# Patient Record
Sex: Female | Born: 1935
Health system: Southern US, Community
[De-identification: ages and names within clinical notes are randomized; demographics above are authoritative.]

## PROBLEM LIST (undated history)

## (undated) DIAGNOSIS — I10 Essential (primary) hypertension: Secondary | ICD-10-CM

## (undated) DIAGNOSIS — E785 Hyperlipidemia, unspecified: Secondary | ICD-10-CM

## (undated) HISTORY — DX: Hyperlipidemia, unspecified: E78.5

## (undated) HISTORY — PX: ABDOMINAL HYSTERECTOMY: SHX81

## (undated) HISTORY — DX: Essential (primary) hypertension: I10

## (undated) HISTORY — PX: KNEE SURGERY: SHX244

---

## 1997-09-29 ENCOUNTER — Other Ambulatory Visit: Admission: RE | Admit: 1997-09-29 | Discharge: 1997-09-29 | Payer: Self-pay | Admitting: Obstetrics & Gynecology

## 2004-02-16 ENCOUNTER — Ambulatory Visit: Payer: Self-pay | Admitting: Cardiology

## 2009-09-16 ENCOUNTER — Inpatient Hospital Stay (HOSPITAL_COMMUNITY): Admission: RE | Admit: 2009-09-16 | Discharge: 2009-09-19 | Payer: Self-pay | Admitting: Orthopedic Surgery

## 2010-01-20 ENCOUNTER — Inpatient Hospital Stay (HOSPITAL_COMMUNITY)
Admission: RE | Admit: 2010-01-20 | Discharge: 2010-01-22 | Payer: Self-pay | Source: Home / Self Care | Attending: Orthopedic Surgery | Admitting: Orthopedic Surgery

## 2010-04-19 LAB — BASIC METABOLIC PANEL
CO2: 28 mEq/L (ref 19–32)
CO2: 29 mEq/L (ref 19–32)
Calcium: 8.6 mg/dL (ref 8.4–10.5)
Creatinine, Ser: 0.9 mg/dL (ref 0.4–1.2)
GFR calc non Af Amer: >60 mL/min (ref >60)
GFR calc non Af Amer: >60 mL/min (ref >60)
Glucose, Bld: 126 mg/dL — ABNORMAL HIGH (ref 70–99)
Sodium: 140 mEq/L (ref 135–145)

## 2010-04-19 LAB — PROTIME-INR
INR: 1.08 (ref 0.00–1.49)
INR: 1.4 (ref 0.00–1.49)
Prothrombin Time: 17.4 seconds — ABNORMAL HIGH (ref 11.6–15.2)

## 2010-04-19 LAB — CBC
HCT: 27.5 % — ABNORMAL LOW (ref 36.0–46.0)
HCT: 30.6 % — ABNORMAL LOW (ref 36.0–46.0)
MCH: 28 pg (ref 26.0–34.0)
MCH: 28.5 pg (ref 26.0–34.0)
MCHC: 32 g/dL (ref 30.0–36.0)
MCHC: 32 g/dL (ref 30.0–36.0)
MCV: 89 fL (ref 78.0–100.0)
RBC: 3.14 MIL/uL — ABNORMAL LOW (ref 3.87–5.11)
RBC: 3.44 MIL/uL — ABNORMAL LOW (ref 3.87–5.11)
RDW: 14.1 % (ref 11.5–15.5)
RDW: 14.2 % (ref 11.5–15.5)
WBC: 10.5 10*3/uL (ref 4.0–10.5)
WBC: 9.7 10*3/uL (ref 4.0–10.5)

## 2010-04-19 LAB — HEMOGLOBIN AND HEMATOCRIT, BLOOD: HCT: 30.7 % — ABNORMAL LOW (ref 36.0–46.0)

## 2010-04-20 LAB — TYPE AND SCREEN: Antibody Screen: NEGATIVE

## 2010-04-20 LAB — URINALYSIS, ROUTINE W REFLEX MICROSCOPIC
Glucose, UA: NEGATIVE mg/dL
Hgb urine dipstick: NEGATIVE
Ketones, ur: NEGATIVE mg/dL
Protein, ur: NEGATIVE mg/dL
Specific Gravity, Urine: 1.026 (ref 1.005–1.030)
pH: 5 (ref 5.0–8.0)

## 2010-04-20 LAB — COMPREHENSIVE METABOLIC PANEL
AST: 17 U/L (ref 0–37)
Albumin: 4 g/dL (ref 3.5–5.2)
Alkaline Phosphatase: 62 U/L (ref 39–117)
BUN: 14 mg/dL (ref 6–23)
Chloride: 103 mEq/L (ref 96–112)
Creatinine, Ser: 1.01 mg/dL (ref 0.4–1.2)
Glucose, Bld: 102 mg/dL — ABNORMAL HIGH (ref 70–99)
Sodium: 137 mEq/L (ref 135–145)

## 2010-04-20 LAB — CBC
MCH: 28.8 pg (ref 26.0–34.0)
Platelets: 267 10*3/uL (ref 150–400)
RDW: 14.3 % (ref 11.5–15.5)

## 2010-04-20 LAB — SURGICAL PCR SCREEN: MRSA, PCR: NEGATIVE

## 2010-04-23 LAB — CBC
HCT: 30.2 % — ABNORMAL LOW (ref 36.0–46.0)
HCT: 31.4 % — ABNORMAL LOW (ref 36.0–46.0)
Hemoglobin: 10.3 g/dL — ABNORMAL LOW (ref 12.0–15.0)
MCH: 29.9 pg (ref 26.0–34.0)
MCH: 30.1 pg (ref 26.0–34.0)
MCH: 31.5 pg (ref 26.0–34.0)
MCHC: 32.7 g/dL (ref 30.0–36.0)
MCHC: 32.8 g/dL (ref 30.0–36.0)
MCHC: 34.4 g/dL (ref 30.0–36.0)
MCV: 91.2 fL (ref 78.0–100.0)
MCV: 91.5 fL (ref 78.0–100.0)
Platelets: 249 10*3/uL (ref 150–400)
Platelets: 279 10*3/uL (ref 150–400)
RBC: 3.65 MIL/uL — ABNORMAL LOW (ref 3.87–5.11)
RDW: 13.9 % (ref 11.5–15.5)
RDW: 14 % (ref 11.5–15.5)

## 2010-04-23 LAB — BASIC METABOLIC PANEL
BUN: 11 mg/dL (ref 6–23)
BUN: 12 mg/dL (ref 6–23)
CO2: 24 mEq/L (ref 19–32)
CO2: 28 mEq/L (ref 19–32)
CO2: 32 mEq/L (ref 19–32)
Calcium: 8.4 mg/dL (ref 8.4–10.5)
Chloride: 100 mEq/L (ref 96–112)
Chloride: 104 mEq/L (ref 96–112)
Creatinine, Ser: 1.03 mg/dL (ref 0.4–1.2)
GFR calc Af Amer: >60 mL/min (ref >60)
Glucose, Bld: 121 mg/dL — ABNORMAL HIGH (ref 70–99)
Glucose, Bld: 135 mg/dL — ABNORMAL HIGH (ref 70–99)
Glucose, Bld: 167 mg/dL — ABNORMAL HIGH (ref 70–99)
Potassium: 4.1 mEq/L (ref 3.5–5.1)
Potassium: 5.3 mEq/L — ABNORMAL HIGH (ref 3.5–5.1)
Sodium: 141 mEq/L (ref 135–145)

## 2010-04-23 LAB — TYPE AND SCREEN: Antibody Screen: NEGATIVE

## 2010-04-23 LAB — SURGICAL PCR SCREEN
MRSA, PCR: NEGATIVE
Staphylococcus aureus: NEGATIVE

## 2010-04-23 LAB — COMPREHENSIVE METABOLIC PANEL
Albumin: 4.4 g/dL (ref 3.5–5.2)
BUN: 15 mg/dL (ref 6–23)
CO2: 29 mEq/L (ref 19–32)
Calcium: 10 mg/dL (ref 8.4–10.5)
Creatinine, Ser: 0.93 mg/dL (ref 0.4–1.2)
GFR calc Af Amer: >60 mL/min (ref >60)
GFR calc non Af Amer: 59 mL/min — ABNORMAL LOW (ref >60)
Sodium: 141 mEq/L (ref 135–145)
Total Protein: 8 g/dL (ref 6.0–8.3)

## 2010-04-23 LAB — URINALYSIS, ROUTINE W REFLEX MICROSCOPIC
Glucose, UA: NEGATIVE mg/dL
Hgb urine dipstick: NEGATIVE
Ketones, ur: NEGATIVE mg/dL
Nitrite: NEGATIVE
Protein, ur: NEGATIVE mg/dL
Specific Gravity, Urine: 1.007 (ref 1.005–1.030)
Urobilinogen, UA: 0.2 mg/dL (ref 0.0–1.0)

## 2010-04-23 LAB — ABO/RH: ABO/RH(D): B POS

## 2011-10-24 IMAGING — CR DG KNEE 1-2V PORT*L*
2 series · 2 of 2 positions shown · non-contrast
Comparison: None

CLINICAL DATA: DJD left knee.  Knee replacement.

PORTABLE LEFT KNEE - 1-2 VIEW

[ap/obl knee]
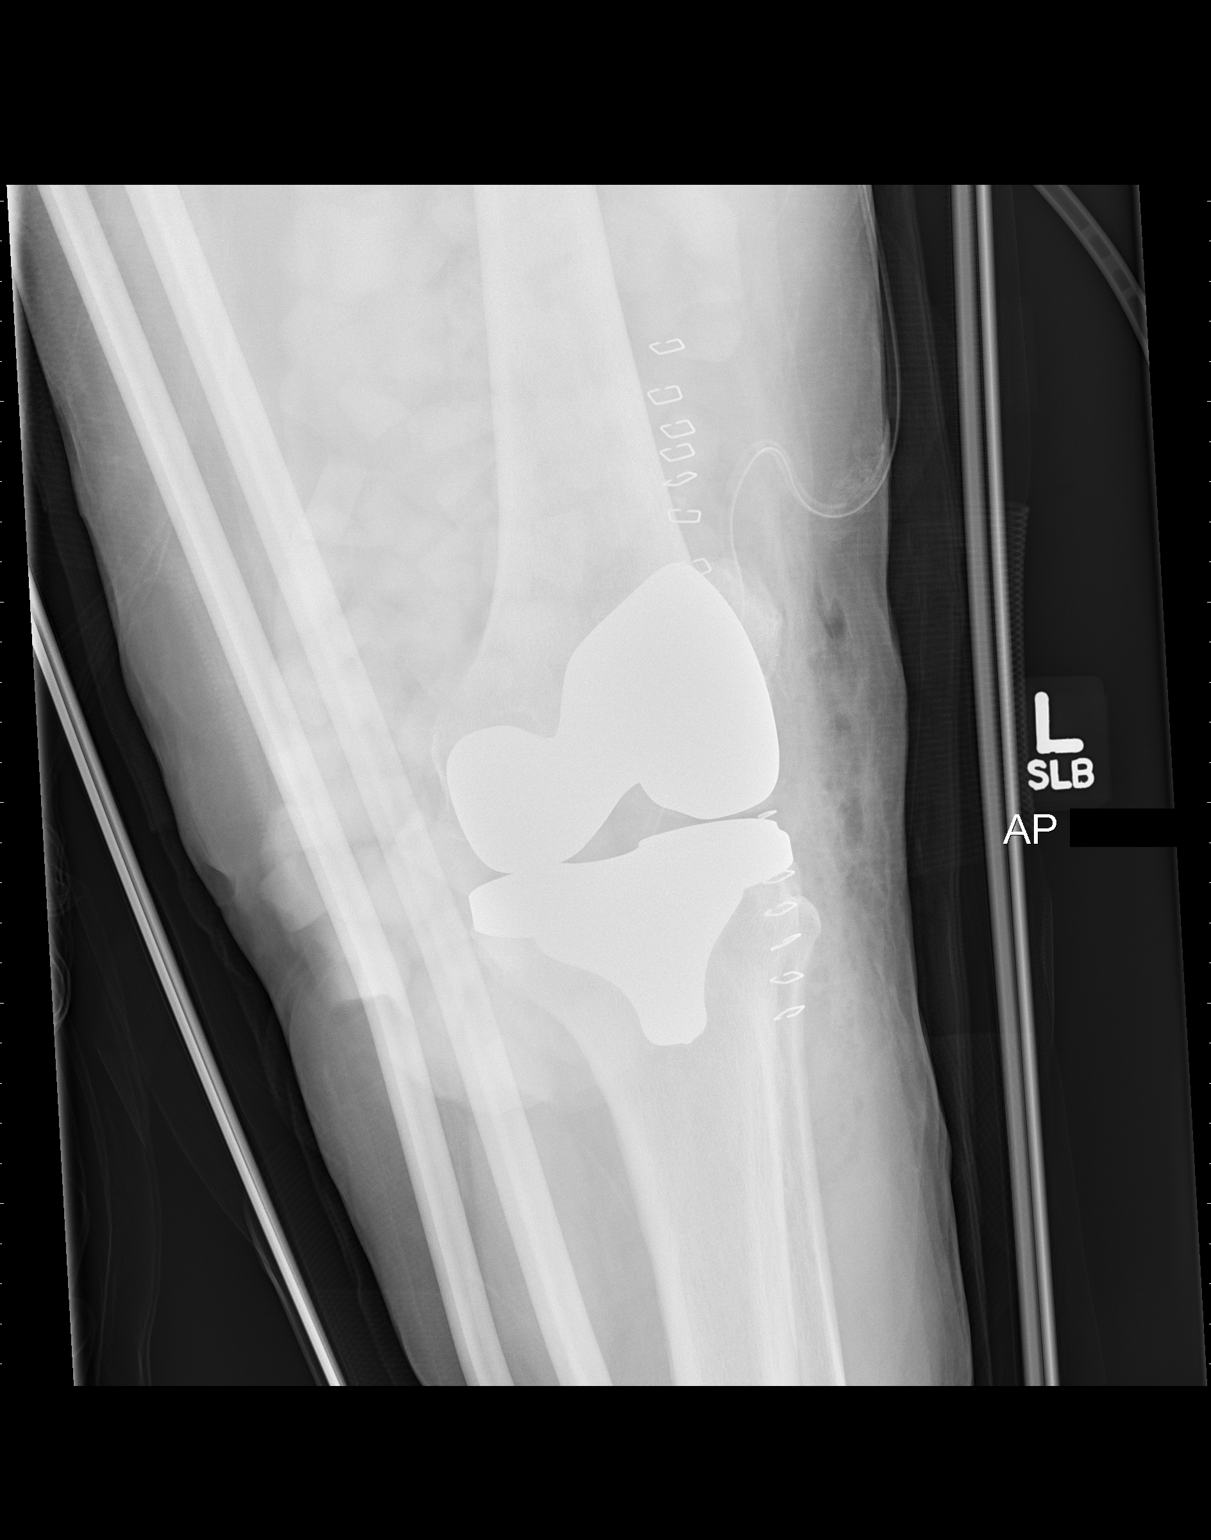

[knee lat]
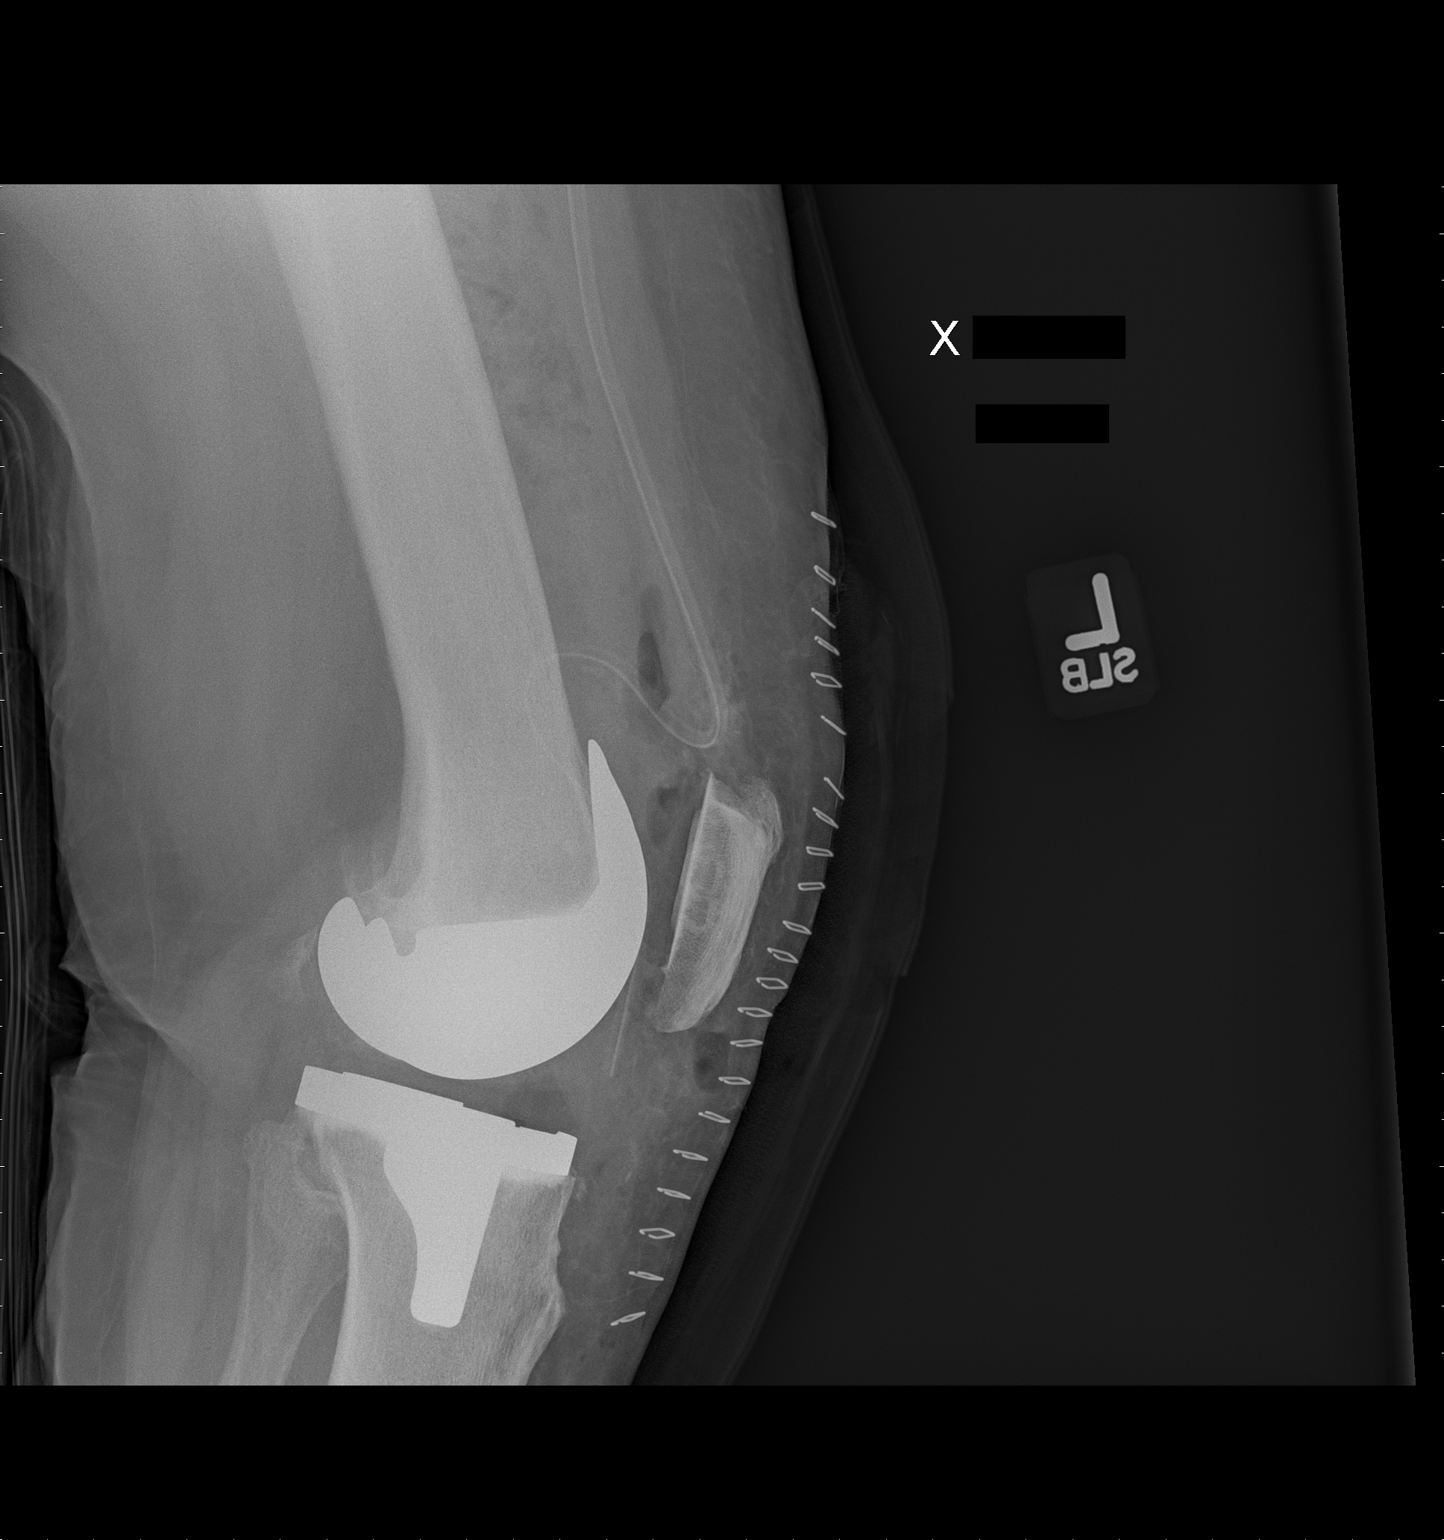

[2 of 2 positions shown; findings below may reference images not displayed]

FINDINGS: The patient is status post left knee replacement.  Soft
tissue and joint space gas present.  There is a soft tissue drain
in place.  No hardware or bony complicating feature.
IMPRESSION: Left knee replacement without complicating feature.

## 2012-06-05 ENCOUNTER — Other Ambulatory Visit: Payer: Self-pay | Admitting: Family Medicine

## 2012-06-14 ENCOUNTER — Encounter: Payer: Self-pay | Admitting: Family Medicine

## 2012-06-14 ENCOUNTER — Ambulatory Visit (INDEPENDENT_AMBULATORY_CARE_PROVIDER_SITE_OTHER): Payer: Medicare Other | Admitting: Family Medicine

## 2012-06-14 VITALS — BP 160/100 | HR 90 | Temp 98.2°F | Resp 16 | Wt 232.0 lb

## 2012-06-14 DIAGNOSIS — I10 Essential (primary) hypertension: Secondary | ICD-10-CM

## 2012-06-14 MED ORDER — AMLODIPINE BESYLATE 5 MG PO TABS: 5.0000 mg | ORAL_TABLET | Freq: Every day | ORAL | Status: DC

## 2012-06-14 NOTE — Progress Notes (Signed)
  Subjective:    Patient ID: Bonnie Bennett, female    DOB: 09/19/1931, 79 y.o.   MRN: [id number]  HPI Patient is here because a home health nurse told her to come in. She was found to have a blood pressure of 160/68. She states that she is taking Lotensin HCT 20/25 one by mouth daily. She states she occasionally misses medication. She also indulges in a high sodium diet. Today my check her blood pressure reveals 160/100. The patient is adamant that she does not want further medications.  She is also on Crestor 20 mg by mouth daily for hyperlipidemia however I am not sure how compliant she is with this.  She is a strong family history of hypertension. Her sister has end-stage renal disease which is hemodialysis dependent.  Have not seen the patient in over a year. She declines a physical. Past Medical History  Diagnosis Date  . Hypertension    Current Outpatient Prescriptions on File Prior to Visit  Medication Sig Dispense Refill  . benazepril-hydrochlorthiazide (LOTENSIN HCT) 20-25 MG per tablet TAKE 1 TABLET EVERY DAY  90 tablet  3  . CRESTOR 20 MG tablet TAKE 1 TABLET EVERY DAY  90 tablet  3   No current facility-administered medications on file prior to visit.   No Known Allergies    Review of Systems Review of systems is otherwise negative    Objective:   Physical Exam  Cardiovascular: Normal rate, regular rhythm and normal heart sounds.   Pulmonary/Chest: Effort normal and breath sounds normal.          Assessment & Plan:  HTN (hypertension)  Patient seems very upset today. She's also in denial. She is now weaning medication. She does not want physical. She states that she is not afraid to die.  A try as best I could to explain the risk of uncontrolled high blood pressure. I recommended she start Norvasc 5 mg by mouth daily, and return in one month to let me check her blood pressure again and to obtain a fasting lipid panel with CMP.  The medication was sent to the  pharmacy. At the conclusion of the visit however I'm afraid that the patient likely not follow up.  I explained to the patient that always be here to help and when she is ready for my help I will gladly give it.

## 2012-10-31 ENCOUNTER — Other Ambulatory Visit: Payer: Self-pay | Admitting: Family Medicine

## 2013-02-18 ENCOUNTER — Encounter: Payer: Self-pay | Admitting: Family Medicine

## 2013-02-18 ENCOUNTER — Other Ambulatory Visit: Payer: Self-pay | Admitting: Family Medicine

## 2013-02-18 NOTE — Telephone Encounter (Signed)
Medication refill for one time only.  Patient needs to be seen.  Letter sent for patient to call and schedule 

## 2013-04-17 ENCOUNTER — Other Ambulatory Visit: Payer: Self-pay | Admitting: Family Medicine

## 2013-04-17 DIAGNOSIS — Z79899 Other long term (current) drug therapy: Secondary | ICD-10-CM

## 2013-04-18 ENCOUNTER — Encounter: Payer: Self-pay | Admitting: Family Medicine

## 2013-04-18 ENCOUNTER — Ambulatory Visit (INDEPENDENT_AMBULATORY_CARE_PROVIDER_SITE_OTHER): Payer: Medicare Other | Admitting: Family Medicine

## 2013-04-18 VITALS — BP 146/70 | HR 74 | Temp 97.6°F | Resp 18 | Ht 63.0 in | Wt 231.0 lb

## 2013-04-18 DIAGNOSIS — I10 Essential (primary) hypertension: Secondary | ICD-10-CM

## 2013-04-18 LAB — LIPID PANEL
CHOLESTEROL: 352 mg/dL — AB (ref 0–200)
HDL: 51 mg/dL (ref >39)
LDL CALC: 280 mg/dL — AB (ref 0–99)
TRIGLYCERIDES: 107 mg/dL (ref <150)
Total CHOL/HDL Ratio: 6.9 Ratio
VLDL: 21 mg/dL (ref 0–40)

## 2013-04-18 LAB — BASIC METABOLIC PANEL
BUN: 15 mg/dL (ref 6–23)
CHLORIDE: 104 meq/L (ref 96–112)
CO2: 28 mEq/L (ref 19–32)
Calcium: 9.7 mg/dL (ref 8.4–10.5)
Creat: 1.03 mg/dL (ref 0.50–1.10)
Glucose, Bld: 93 mg/dL (ref 70–99)
POTASSIUM: 4.9 meq/L (ref 3.5–5.3)
Sodium: 140 mEq/L (ref 135–145)

## 2013-04-18 MED ORDER — METOPROLOL SUCCINATE ER 25 MG PO TB24: 25.0000 mg | ORAL_TABLET | Freq: Every day | ORAL | Status: DC

## 2013-04-18 NOTE — Progress Notes (Signed)
   Subjective:    Patient ID: Bonnie Bennett, female    DOB: 1931/08/28, 80 y.o.   MRN: [id number]  HPI  Patient is here today for a blood pressure check. She is not taking amlodipine. She is not taking benazepril/hydrochlorothiazide. She states the medications make her feel weird and she does not want to take them. Her blood pressure is elevated at 146/70. She denies any chest pain shortness of breath or dyspnea on exertion. She does have a 7 mm ball on her lower right abdomen. It is not changing. It has sharp well-circumscribed borders. It is light brown in color. There no concerning characteristics. Past Medical History  Diagnosis Date  . Hypertension    Current Outpatient Prescriptions on File Prior to Visit  Medication Sig Dispense Refill  . amLODipine (NORVASC) 5 MG tablet Take 1 tablet (5 mg total) by mouth daily.  30 tablet  11  . benazepril-hydrochlorthiazide (LOTENSIN HCT) 20-25 MG per tablet TAKE 1 TABLET BY MOUTH DAILY  30 tablet  0   No current facility-administered medications on file prior to visit.   No Known Allergies History   Social History  . Marital Status: Widowed    Spouse Name: N/A    Number of Children: N/A  . Years of Education: N/A   Occupational History  . Not on file.   Social History Main Topics  . Smoking status: Never Smoker   . Smokeless tobacco: Not on file  . Alcohol Use: Yes     Comment: occasional  . Drug Use: No  . Sexual Activity: Not on file   Other Topics Concern  . Not on file   Social History Narrative  . No narrative on file     Review of Systems  All other systems reviewed and are negative.       Objective:   Physical Exam  Vitals reviewed. Constitutional: She appears well-developed and well-nourished. No distress.  Neck: Neck supple. No JVD present. No thyromegaly present.  Cardiovascular: Normal rate, regular rhythm and normal heart sounds.   No murmur heard. Pulmonary/Chest: Effort normal and breath sounds  normal. No respiratory distress. She has no wheezes. She has no rales.  Abdominal: Soft. Bowel sounds are normal.  Lymphadenopathy:    She has no cervical adenopathy.  Skin: She is not diaphoretic.   7 mm mole on the lower right abdomen.       Assessment & Plan:  1. HTN (hypertension) Patient is not compliant with amlodipine or Lotensin HCTZ. Therefore I recommended she stop his medications. Start Toprol-XL 25 mg by mouth daily and check a CMP and fasting lipid panel. - metoprolol succinate (TOPROL-XL) 25 MG 24 hr tablet; Take 1 tablet (25 mg total) by mouth daily.  Dispense: 90 tablet; Refill: 3 - Basic Metabolic Panel - Lipid Panel I reassured the patient that I believe this is a benign mole with no concerning features. I did offer the patient a biopsy to be 100% sure but she elected to simply to monitor the lesion for now.

## 2013-04-19 ENCOUNTER — Encounter: Payer: Self-pay | Admitting: Family Medicine

## 2013-04-19 ENCOUNTER — Other Ambulatory Visit: Payer: Self-pay | Admitting: *Deleted

## 2013-04-19 DIAGNOSIS — E785 Hyperlipidemia, unspecified: Secondary | ICD-10-CM | POA: Insufficient documentation

## 2013-04-19 MED ORDER — ATORVASTATIN CALCIUM 80 MG PO TABS: 80.0000 mg | ORAL_TABLET | Freq: Every day | ORAL | Status: DC

## 2013-04-19 MED ORDER — ROSUVASTATIN CALCIUM 5 MG PO TABS: 5.0000 mg | ORAL_TABLET | Freq: Every day | ORAL | Status: DC

## 2013-04-19 MED ORDER — ROSUVASTATIN CALCIUM 40 MG PO TABS: 40.0000 mg | ORAL_TABLET | Freq: Every day | ORAL | Status: DC

## 2013-04-19 NOTE — Telephone Encounter (Signed)
Per orders noted on labs, Lipitor sent to pharmacy.   Call placed to patient and patient made aware.   Patient states that she is not going to take Lipitor, but agreed to take Crestor.

## 2013-04-19 NOTE — Telephone Encounter (Signed)
Received call from pharmacy requesting clarification on prescriptions. Advised that Crestor is current prescription.

## 2013-08-28 ENCOUNTER — Telehealth: Payer: Self-pay | Admitting: Family Medicine

## 2013-08-28 NOTE — Telephone Encounter (Signed)
Attempted to contact pt phone rang constantly  Pt is needing to schedule [facility] and CPE

## 2014-01-21 ENCOUNTER — Ambulatory Visit (INDEPENDENT_AMBULATORY_CARE_PROVIDER_SITE_OTHER): Payer: Medicare Other | Admitting: Family Medicine

## 2014-01-21 ENCOUNTER — Encounter: Payer: Self-pay | Admitting: Family Medicine

## 2014-01-21 VITALS — BP 178/80 | HR 64 | Temp 97.4°F | Resp 18 | Ht 64.0 in | Wt 231.0 lb

## 2014-01-21 DIAGNOSIS — Z23 Encounter for immunization: Secondary | ICD-10-CM

## 2014-01-21 DIAGNOSIS — Z0000 Encounter for general adult medical examination without abnormal findings: Secondary | ICD-10-CM

## 2014-01-21 NOTE — Addendum Note (Signed)
Addended by: Donne AnonPLUMMER, Malahki Gasaway M on: 01/21/2014 12:24 PM   Modules accepted: Orders

## 2014-01-21 NOTE — Progress Notes (Signed)
Subjective:    Patient ID: Bonnie Bennett, female    DOB: 09-05-1931, 80 y.o.   MRN: [id number]  HPI   As a very sweet 80 year old African-American female who is also extremely headstrong. She has poorly controlled hypertension and some of the worst LDL cholesterol numbers I have ever seen. She is not taking amlodipine. She is not taking Lotensin HCTZ. She is taking metoprolol. Her noncompliance of medication is reflected in her blood pressure of 178/80. I last checked her cholesterol in March. At that time her LDL cholesterol was 280. Patient refused Lipitor. She took Crestor for a few days and then discontinued it. She refuses any further medication for blood pressure. She refuses any further medication for her cholesterol. She refuses a mammogram. She refuses a bone density. She has a history of a hysterectomy and therefore does not require a Pap smear. Due to her age I do not think going cancer screening is necessary take regular her uncontrolled risk factors for heart disease.   She is due today for her flu shot as well as Prevnar 13. Past Medical History  Diagnosis Date  . Hypertension   . HLD (hyperlipidemia)    Past Surgical History  Procedure Laterality Date  . Abdominal hysterectomy     Current Outpatient Prescriptions on File Prior to Visit  Medication Sig Dispense Refill  . metoprolol succinate (TOPROL-XL) 25 MG 24 hr tablet Take 1 tablet (25 mg total) by mouth daily. 90 tablet 3  . amLODipine (NORVASC) 5 MG tablet Take 1 tablet (5 mg total) by mouth daily. (Patient not taking: Reported on 01/21/2014) 30 tablet 11  . benazepril-hydrochlorthiazide (LOTENSIN HCT) 20-25 MG per tablet TAKE 1 TABLET BY MOUTH DAILY (Patient not taking: Reported on 01/21/2014) 30 tablet 0  . rosuvastatin (CRESTOR) 40 MG tablet Take 1 tablet (40 mg total) by mouth daily. (Patient not taking: Reported on 01/21/2014) 90 tablet 3   No current facility-administered medications on file prior to visit.    No Known Allergies History   Social History  . Marital Status: Widowed    Spouse Name: N/A    Number of Children: N/A  . Years of Education: N/A   Occupational History  . Not on file.   Social History Main Topics  . Smoking status: Never Smoker   . Smokeless tobacco: Not on file  . Alcohol Use: Yes     Comment: occasional  . Drug Use: No  . Sexual Activity: Not on file   Other Topics Concern  . Not on file   Social History Narrative   No family history on file.    Review of Systems  All other systems reviewed and are negative.      Objective:   Physical Exam  Constitutional: She is oriented to person, place, and time. She appears well-developed and well-nourished. No distress.  HENT:  Head: Normocephalic and atraumatic.  Right Ear: External ear normal.  Left Ear: External ear normal.  Nose: Nose normal.  Mouth/Throat: Oropharynx is clear and moist. No oropharyngeal exudate.  Eyes: Conjunctivae and EOM are normal. Pupils are equal, round, and reactive to light.  Neck: Normal range of motion. Neck supple. No JVD present. No thyromegaly present.  Cardiovascular: Normal rate, regular rhythm and normal heart sounds.  Exam reveals no gallop and no friction rub.   No murmur heard. Pulmonary/Chest: Effort normal and breath sounds normal. No respiratory distress. She has no wheezes. She has no rales.  Abdominal: Soft. Bowel sounds are  normal. She exhibits no distension and no mass. There is no tenderness. There is no rebound and no guarding.  Musculoskeletal: Normal range of motion. She exhibits no edema or tenderness.  Lymphadenopathy:    She has no cervical adenopathy.  Neurological: She is alert and oriented to person, place, and time. She has normal reflexes. She displays normal reflexes. No cranial nerve deficit. She exhibits normal muscle tone. Coordination normal.  Skin: Skin is warm. No rash noted. No erythema. No pallor.  Psychiatric: She has a normal mood  and affect. Her behavior is normal. Judgment and thought content normal.  Vitals reviewed.         Assessment & Plan:  Routine general medical examination at a health care facility   Patient refuses every one of my recommendations. She will not treat her blood pressure. She will not treat her cholesterol. She will consent to a flu shot and Prevnar 13. She refuses a mammogram. She refuses a bone density. She refuses a colonoscopy. I did my best to explain the risk and benefit of treating her conditions and the patient is willing to except the risk and does not want to treat her blood pressure or cholesterol against my medical advice. I will be glad to help this very nice lady whenever she wants my help.,

## 2014-05-20 ENCOUNTER — Ambulatory Visit (INDEPENDENT_AMBULATORY_CARE_PROVIDER_SITE_OTHER): Payer: Medicare HMO | Admitting: Family Medicine

## 2014-05-20 ENCOUNTER — Encounter: Payer: Self-pay | Admitting: Family Medicine

## 2014-05-20 VITALS — BP 138/88 | HR 76 | Temp 97.4°F | Resp 14 | Ht 64.0 in | Wt 238.0 lb

## 2014-05-20 DIAGNOSIS — R112 Nausea with vomiting, unspecified: Secondary | ICD-10-CM | POA: Diagnosis not present

## 2014-05-20 DIAGNOSIS — R42 Dizziness and giddiness: Secondary | ICD-10-CM | POA: Diagnosis not present

## 2014-05-20 MED ORDER — MECLIZINE HCL 25 MG PO TABS: 25.0000 mg | ORAL_TABLET | Freq: Three times a day (TID) | ORAL | Status: DC | PRN

## 2014-05-20 NOTE — Patient Instructions (Signed)
Do not take your blood pressure medication until tomorrow Take the meclizine as prescribed  Call if do not get better F/U as previous Dr. Tanya NonesPickard

## 2014-05-20 NOTE — Progress Notes (Signed)
Patient ID: Bonnie Bennett, female   DOB: 09-12-1931, 81 y.o.   MRN: [id number]   Subjective:    Patient ID: Bonnie MiuJessie M Bennett, female    DOB: 09-12-1931, 81 y.o.   MRN: [id number]  Patient presents for Illness  patient presents with dizzy episode nausea vomiting. Yesterday she was in her normal state of health she went to bed and was feeling fine. She woke up around 6 AM to get to the bathroom and had a severe spinning sensation she had to hold onto the wall to get their she tried to drink some water and some ginger ale but then vomited afterwards. Her dizziness settled down and she went to eat breakfast when she tried to get up again and had spinning and had another episode of vomiting on her way here in the car. She states that she has not been sick at all had no fever no abdominal pain or change in his stools. She had a similar episode 3 months ago when she was at her daughter's house and everything started spinning. He has felt like there is been some fluid in her ears recently.    Review Of Systems:  GEN- denies fatigue, fever, weight loss,weakness, recent illness HEENT- denies eye drainage, change in vision, nasal discharge, CVS- denies chest pain, palpitations RESP- denies SOB, cough, wheeze ABD- + N/V,denies  change in stools, abd pain GU- denies dysuria, hematuria, dribbling, incontinence MSK- denies joint pain, muscle aches, injury Neuro- denies headache, +dizziness, syncope, seizure activity       Objective:    BP 138/88 mmHg  Pulse 76  Temp(Src) 97.4 F (36.3 C) (Oral)  Resp 14  Ht 5\' 4"  (1.626 m)  Wt 238 lb (107.956 kg)  BMI 40.83 kg/m2 GEN- NAD, alert and oriented x3 HEENT- PERRL, EOMI, non injected sclera, pink conjunctiva, MMM, oropharynx clear, Right TM clear, clear fluid in left TM Neck- Supple, no LAD CVS- RRR, no murmur RESP-CTAB ABD-NABS,soft,NT,ND Neuro- CNII-XII intact, dizzy after sitting up from lying position EXT- No edema Pulses- Radial,-  2+        Assessment & Plan:      Problem List Items Addressed This Visit    None    Visit Diagnoses    Vertigo    -  Primary    Symptoms more conistent with vertigo episode, not ill appearing, advised phenergan to help with nausea, but she declined, she agrees to try the meclizine. Similar episode 3 months ago. She will call for any changes    Non-intractable vomiting with nausea, vomiting of unspecified type           Note: This dictation was prepared with Dragon dictation along with smaller phrase technology. Any transcriptional errors that result from this process are unintentional.

## 2014-09-09 ENCOUNTER — Other Ambulatory Visit: Payer: Self-pay | Admitting: Family Medicine

## 2015-06-12 ENCOUNTER — Ambulatory Visit: Payer: Medicare HMO | Admitting: Family Medicine

## 2015-06-12 VITALS — BP 158/80 | HR 72 | Temp 97.6°F | Resp 18

## 2015-06-12 DIAGNOSIS — R42 Dizziness and giddiness: Secondary | ICD-10-CM

## 2015-06-12 DIAGNOSIS — I10 Essential (primary) hypertension: Secondary | ICD-10-CM

## 2015-06-12 MED ORDER — MECLIZINE HCL 25 MG PO TABS: 25.0000 mg | ORAL_TABLET | Freq: Three times a day (TID) | ORAL | Status: DC | PRN

## 2015-06-12 NOTE — Progress Notes (Signed)
Pt came in for BP check.  Had been having some dizzy spells w/ mild nausea this morning.  Denies any chest pain.  Denies any shortness of breath.  Feeling fine currently.  Here with daughter.  Was out in the heat yesterday doing yard work.  Has big even planned for Monday and daughter thinks may be stress related.  BP LA 160/84  P 72  R 18  T 97.6    BP RA 158/80  Rx for meclizine from last year.  Pt stated she had none left.  Sent refill.  Does not feel she needs to be seen today, feeling fine at present.  Encouraged her to make appt next week to see PCP for routine check up as she had not been seen in a while.  Told her to come fasting.  Also said may be slightly dehydrated if had been out a lot yesterday, so drink more water.  Appt made for Thursday.

## 2015-06-18 ENCOUNTER — Encounter: Payer: Self-pay | Admitting: Family Medicine

## 2015-06-18 ENCOUNTER — Ambulatory Visit (INDEPENDENT_AMBULATORY_CARE_PROVIDER_SITE_OTHER): Payer: Medicare HMO | Admitting: Family Medicine

## 2015-06-18 VITALS — BP 190/80 | HR 86 | Temp 97.7°F | Resp 16 | Ht 63.0 in | Wt 236.0 lb

## 2015-06-18 DIAGNOSIS — I10 Essential (primary) hypertension: Secondary | ICD-10-CM

## 2015-06-18 LAB — COMPLETE METABOLIC PANEL WITH GFR
ALBUMIN: 4 g/dL (ref 3.6–5.1)
ALK PHOS: 52 U/L (ref 33–130)
ALT: 8 U/L (ref 6–29)
AST: 16 U/L (ref 10–35)
BUN: 8 mg/dL (ref 7–25)
CALCIUM: 9.4 mg/dL (ref 8.6–10.4)
CO2: 24 mmol/L (ref 20–31)
CREATININE: 0.92 mg/dL — AB (ref 0.60–0.88)
Chloride: 107 mmol/L (ref 98–110)
GFR, EST AFRICAN AMERICAN: 68 mL/min (ref >=60)
GFR, Est Non African American: 59 mL/min — ABNORMAL LOW (ref >=60)
Glucose, Bld: 62 mg/dL — ABNORMAL LOW (ref 70–99)
POTASSIUM: 4.4 mmol/L (ref 3.5–5.3)
Sodium: 142 mmol/L (ref 135–146)
Total Bilirubin: 0.4 mg/dL (ref 0.2–1.2)
Total Protein: 7.1 g/dL (ref 6.1–8.1)

## 2015-06-18 LAB — LIPID PANEL
CHOLESTEROL: 292 mg/dL — AB (ref 125–200)
HDL: 54 mg/dL (ref >=46)
LDL Cholesterol: 215 mg/dL — ABNORMAL HIGH (ref <130)
TRIGLYCERIDES: 115 mg/dL (ref <150)
Total CHOL/HDL Ratio: 5.4 Ratio — ABNORMAL HIGH (ref <=5.0)
VLDL: 23 mg/dL (ref <30)

## 2015-06-18 MED ORDER — LISINOPRIL-HYDROCHLOROTHIAZIDE 20-25 MG PO TABS: 1.0000 | ORAL_TABLET | Freq: Every day | ORAL | Status: DC

## 2015-06-18 NOTE — Progress Notes (Signed)
Subjective:    Patient ID: Bonnie Bennett, female    DOB: 10/31/1931, 82 y.o.   MRN: [id number]  HPI 01/21/14   As a very sweet 82 year old African-American female who is also extremely headstrong. She has poorly controlled hypertension and some of the worst LDL cholesterol numbers I have ever seen. She is not taking amlodipine. She is not taking Lotensin HCTZ. She is taking metoprolol. Her noncompliance of medication is reflected in her blood pressure of 178/80. I last checked her cholesterol in March. At that time her LDL cholesterol was 280. Patient refused Lipitor. She took Crestor for a few days and then discontinued it. She refuses any further medication for blood pressure. She refuses any further medication for her cholesterol. She refuses a mammogram. She refuses a bone density. She has a history of a hysterectomy and therefore does not require a Pap smear. Due to her age I do not think going cancer screening is necessary take regular her uncontrolled risk factors for heart disease.   She is due today for her flu shot as well as Prevnar 13.  At that time, my plan was:  Patient refuses every one of my recommendations. She will not treat her blood pressure. She will not treat her cholesterol. She will consent to a flu shot and Prevnar 13. She refuses a mammogram. She refuses a bone density. She refuses a colonoscopy. I did my best to explain the risk and benefit of treating her conditions and the patient is willing to except the risk and does not want to treat her blood pressure or cholesterol against my medical advice. I will be glad to help this very nice lady whenever she wants my help.,  06/18/15 She has not been taking any of her medication.[name]. Her blood pressure is extremely high today. She denies any chest pain shortness of breath dyspnea on exertion. She denies any orthopnea or paroxysmal nocturnal dyspnea. She has been having occasional dull headaches. She denies any dizziness Past  Medical History  Diagnosis Date  . Hypertension   . HLD (hyperlipidemia)    Past Surgical History  Procedure Laterality Date  . Abdominal hysterectomy     Current Outpatient Prescriptions on File Prior to Visit  Medication Sig Dispense Refill  . aspirin EC 81 MG tablet Take 81 mg by mouth daily. Reported on 06/12/2015    . meclizine (ANTIVERT) 25 MG tablet Take 1 tablet (25 mg total) by mouth 3 (three) times daily as needed for dizziness. 30 tablet 0  . Multiple Vitamin (MULTIVITAMIN) tablet Take 1 tablet by mouth daily.    . metoprolol succinate (TOPROL-XL) 25 MG 24 hr tablet TAKE 1 TABLET EVERY DAY (Patient not taking: Reported on 06/18/2015) 90 tablet 2   No current facility-administered medications on file prior to visit.   No Known Allergies Social History   Social History  . Marital Status: Widowed    Spouse Name: N/A  . Number of Children: N/A  . Years of Education: N/A   Occupational History  . Not on file.   Social History Main Topics  . Smoking status: Never Smoker   . Smokeless tobacco: Not on file  . Alcohol Use: Yes     Comment: occasional  . Drug Use: No  . Sexual Activity: Not on file   Other Topics Concern  . Not on file   Social History Narrative   No family history on file.    Review of Systems  All other systems reviewed and  are negative.      Objective:   Physical Exam  Constitutional: She is oriented to person, place, and time. She appears well-developed and well-nourished. No distress.  HENT:  Head: Normocephalic and atraumatic.  Right Ear: External ear normal.  Left Ear: External ear normal.  Nose: Nose normal.  Mouth/Throat: Oropharynx is clear and moist. No oropharyngeal exudate.  Eyes: Conjunctivae and EOM are normal. Pupils are equal, round, and reactive to light.  Neck: Normal range of motion. Neck supple. No JVD present. No thyromegaly present.  Cardiovascular: Normal rate, regular rhythm and normal heart sounds.  Exam reveals  no gallop and no friction rub.   No murmur heard. Pulmonary/Chest: Effort normal and breath sounds normal. No respiratory distress. She has no wheezes. She has no rales.  Abdominal: Soft. Bowel sounds are normal. She exhibits no distension and no mass. There is no tenderness. There is no rebound and no guarding.  Musculoskeletal: Normal range of motion. She exhibits no edema or tenderness.  Lymphadenopathy:    She has no cervical adenopathy.  Neurological: She is alert and oriented to person, place, and time. She has normal reflexes. No cranial nerve deficit. She exhibits normal muscle tone. Coordination normal.  Skin: Skin is warm. No rash noted. No erythema. No pallor.  Psychiatric: She has a normal mood and affect. Her behavior is normal. Judgment and thought content normal.  Vitals reviewed.         Assessment & Plan:  Benign essential HTN - Plan: lisinopril-hydrochlorothiazide (PRINZIDE,ZESTORETIC) 20-25 MG tablet, COMPLETE METABOLIC PANEL WITH GFR, Lipid panel  Blood pressure is out of control. She refuses take metoprolol also I will start her on Zestoretic 20/25 one by mouth daily and recheck her blood pressure in 2 weeks. I will also check a fasting lipid panel. The last time I checked her cholesterol it was terrible however she refused any statin medication to treat it.

## 2015-07-02 ENCOUNTER — Ambulatory Visit (INDEPENDENT_AMBULATORY_CARE_PROVIDER_SITE_OTHER): Payer: Medicare HMO | Admitting: Family Medicine

## 2015-07-02 ENCOUNTER — Encounter: Payer: Self-pay | Admitting: Family Medicine

## 2015-07-02 VITALS — BP 140/78 | HR 80 | Temp 97.8°F | Resp 18 | Ht 63.0 in | Wt 236.0 lb

## 2015-07-02 DIAGNOSIS — I10 Essential (primary) hypertension: Secondary | ICD-10-CM | POA: Diagnosis not present

## 2015-07-02 NOTE — Addendum Note (Signed)
Addended by: Legrand RamsWILLIS, SANDY B on: 07/02/2015 09:42 AM   Modules accepted: Orders, Medications

## 2015-07-02 NOTE — Progress Notes (Signed)
Subjective:    Patient ID: Bonnie Bennett, female    DOB: February 24, 1931, 82 y.o.   MRN: [id number]  HPI 01/21/14   As a very sweet 82 year old African-American female who is also extremely headstrong. She has poorly controlled hypertension and some of the worst LDL cholesterol numbers I have ever seen. She is not taking amlodipine. She is not taking Lotensin HCTZ. She is taking metoprolol. Her noncompliance of medication is reflected in her blood pressure of 178/80. I last checked her cholesterol in March. At that time her LDL cholesterol was 280. Patient refused Lipitor. She took Crestor for a few days and then discontinued it. She refuses any further medication for blood pressure. She refuses any further medication for her cholesterol. She refuses a mammogram. She refuses a bone density. She has a history of a hysterectomy and therefore does not require a Pap smear. Due to her age I do not think going cancer screening is necessary take regular her uncontrolled risk factors for heart disease.   She is due today for her flu shot as well as Prevnar 13.  At that time, my plan was:  Patient refuses every one of my recommendations. She will not treat her blood pressure. She will not treat her cholesterol. She will consent to a flu shot and Prevnar 13. She refuses a mammogram. She refuses a bone density. She refuses a colonoscopy. I did my best to explain the risk and benefit of treating her conditions and the patient is willing to except the risk and does not want to treat her blood pressure or cholesterol against my medical advice. I will be glad to help this very nice lady whenever she wants my help.,  06/18/15 She has not been taking any of her medication.[name] Her blood pressure is extremely high today. She denies any chest pain shortness of breath dyspnea on exertion. She denies any orthopnea or paroxysmal nocturnal dyspnea. She has been having occasional dull headaches. She denies any dizziness.  At that  time, my plan was: Blood pressure is out of control. She refuses take metoprolol also I will start her on Zestoretic 20/25 one by mouth daily and recheck her blood pressure in 2 weeks. I will also check a fasting lipid panel. The last time I checked her cholesterol it was terrible however she refused any statin medication to treat it.  07/02/15 Blood pressure today is much better. However inexplicably, the patient picked up the lisinopril but never started taking it. I verified with the pharmacist that she picked up the medication. However the patient is adamant that she is not taking it. She states that she is only taking metoprolol. Her blood pressure today is excellent. It showed a lab work below her cholesterol was terrible but she continues to refuse statin medication Past Medical History  Diagnosis Date  . Hypertension   . HLD (hyperlipidemia)    Past Surgical History  Procedure Laterality Date  . Abdominal hysterectomy     Current Outpatient Prescriptions on File Prior to Visit  Medication Sig Dispense Refill  . aspirin EC 81 MG tablet Take 81 mg by mouth daily. Reported on 06/12/2015    . lisinopril-hydrochlorothiazide (PRINZIDE,ZESTORETIC) 20-25 MG tablet Take 1 tablet by mouth daily. 90 tablet 3  . meclizine (ANTIVERT) 25 MG tablet Take 1 tablet (25 mg total) by mouth 3 (three) times daily as needed for dizziness. 30 tablet 0  . metoprolol succinate (TOPROL-XL) 25 MG 24 hr tablet TAKE 1 TABLET EVERY DAY (  Patient not taking: Reported on 06/18/2015) 90 tablet 2  . Multiple Vitamin (MULTIVITAMIN) tablet Take 1 tablet by mouth daily.     No current facility-administered medications on file prior to visit.   No Known Allergies Social History   Social History  . Marital Status: Widowed    Spouse Name: N/A  . Number of Children: N/A  . Years of Education: N/A   Occupational History  . Not on file.   Social History Main Topics  . Smoking status: Never Smoker   . Smokeless tobacco:  Not on file  . Alcohol Use: Yes     Comment: occasional  . Drug Use: No  . Sexual Activity: Not on file   Other Topics Concern  . Not on file   Social History Narrative   No family history on file.    Review of Systems  All other systems reviewed and are negative.      Objective:   Physical Exam  Constitutional: She appears well-developed and well-nourished. No distress.  Neck: No JVD present. No thyromegaly present.  Cardiovascular: Normal rate, regular rhythm and normal heart sounds.  Exam reveals no gallop and no friction rub.   No murmur heard. Pulmonary/Chest: Effort normal and breath sounds normal. She has no wheezes. She has no rales.  Psychiatric: She has a normal mood and affect. Her behavior is normal. Judgment and thought content normal.  Vitals reviewed.     Office Visit on 06/18/2015  Component Date Value Ref Range Status  . Sodium 06/18/2015 142  135 - 146 mmol/L Final  . Potassium 06/18/2015 4.4  3.5 - 5.3 mmol/L Final  . Chloride 06/18/2015 107  98 - 110 mmol/L Final  . CO2 06/18/2015 24  20 - 31 mmol/L Final  . Glucose, Bld 06/18/2015 62* 70 - 99 mg/dL Final  . BUN 06/18/2015 8  7 - 25 mg/dL Final  . Creat 06/18/2015 0.92* 0.60 - 0.88 mg/dL Final  . Total Bilirubin 06/18/2015 0.4  0.2 - 1.2 mg/dL Final  . Alkaline Phosphatase 06/18/2015 52  33 - 130 U/L Final  . AST 06/18/2015 16  10 - 35 U/L Final  . ALT 06/18/2015 8  6 - 29 U/L Final  . Total Protein 06/18/2015 7.1  6.1 - 8.1 g/dL Final  . Albumin 06/18/2015 4.0  3.6 - 5.1 g/dL Final  . Calcium 06/18/2015 9.4  8.6 - 10.4 mg/dL Final  . GFR, Est African American 06/18/2015 68  >=60 mL/min Final  . GFR, Est Non African American 06/18/2015 59* >=60 mL/min Final   Comment:   The estimated GFR is a calculation valid for adults (>=80 years old) that uses the CKD-EPI algorithm to adjust for age and sex. It is   not to be used for children, pregnant women, hospitalized patients,    patients on  dialysis, or with rapidly changing kidney function. According to the NKDEP, eGFR >89 is normal, 60-89 shows mild impairment, 30-59 shows moderate impairment, 15-29 shows severe impairment and <15 is ESRD.     [name] Cholesterol 06/18/2015 292* 125 - 200 mg/dL Final  . Triglycerides 06/18/2015 115  <150 mg/dL Final  . HDL 06/18/2015 54  >=46 mg/dL Final  . Total CHOL/HDL Ratio 06/18/2015 5.4* <=5.0 Ratio Final  . VLDL 06/18/2015 23  <30 mg/dL Final  . LDL Cholesterol 06/18/2015 215* <130 mg/dL Final   Comment:   Total Cholesterol/HDL Ratio:CHD Risk  Coronary Heart Disease Risk Table                                        Men       Women          1/2 Average Risk              3.4        3.3              Average Risk              5.0        4.4           2X Average Risk              9.6        7.1           3X Average Risk             23.4       11.0 Use the calculated Patient Ratio above and the CHD Risk table  to determine the patient's CHD Risk.        Assessment & Plan:  Benign essential HTN Blood pressure is now controlled on metoprolol. Discontinue lisinopril and continue only metoprolol. Patient refuses statin for hyperlipidemia. I admitted him a little frustrated. She is a very sweet lady but I cannot seem to explain how dangerous her cholesterol is. I will do whatever I can help her but she refuses cholesterol medication and wants to continue Metamucil

## 2015-07-03 ENCOUNTER — Telehealth: Payer: Self-pay | Admitting: Family Medicine

## 2015-07-03 ENCOUNTER — Other Ambulatory Visit: Payer: Self-pay | Admitting: Family Medicine

## 2015-07-03 MED ORDER — LISINOPRIL-HYDROCHLOROTHIAZIDE 20-25 MG PO TABS: 1.0000 | ORAL_TABLET | Freq: Every day | ORAL | Status: DC

## 2015-07-03 NOTE — Telephone Encounter (Signed)
Patient called in this morning to let you know that she isn't taken metoprool like she told you yesterday she is actually taken the new medication Lisinopril that you prescribed. She is sorry for the mix up.  CB# [phone]

## 2015-07-03 NOTE — Telephone Encounter (Signed)
Continue lisinopril as it is working.

## 2015-08-29 ENCOUNTER — Other Ambulatory Visit: Payer: Self-pay | Admitting: Family Medicine

## 2015-10-17 DIAGNOSIS — Z0000 Encounter for general adult medical examination without abnormal findings: Secondary | ICD-10-CM | POA: Diagnosis not present

## 2015-10-17 DIAGNOSIS — I10 Essential (primary) hypertension: Secondary | ICD-10-CM | POA: Diagnosis not present

## 2015-12-17 ENCOUNTER — Encounter: Payer: Self-pay | Admitting: Family Medicine

## 2015-12-17 ENCOUNTER — Ambulatory Visit (INDEPENDENT_AMBULATORY_CARE_PROVIDER_SITE_OTHER): Payer: Medicare HMO | Admitting: Family Medicine

## 2015-12-17 VITALS — BP 130/68 | HR 78 | Temp 98.0°F | Resp 16 | Ht 63.0 in | Wt 234.0 lb

## 2015-12-17 DIAGNOSIS — I10 Essential (primary) hypertension: Secondary | ICD-10-CM | POA: Diagnosis not present

## 2015-12-17 DIAGNOSIS — E7800 Pure hypercholesterolemia, unspecified: Secondary | ICD-10-CM | POA: Diagnosis not present

## 2015-12-17 DIAGNOSIS — Z23 Encounter for immunization: Secondary | ICD-10-CM | POA: Diagnosis not present

## 2015-12-17 LAB — CBC WITH DIFFERENTIAL/PLATELET
BASOS ABS: 0 {cells}/uL (ref 0–200)
Basophils Relative: 0 %
EOS PCT: 2 %
Eosinophils Absolute: 154 cells/uL (ref 15–500)
HCT: 41.7 % (ref 35.0–45.0)
HEMOGLOBIN: 13.4 g/dL (ref 12.0–15.0)
LYMPHS ABS: 3080 {cells}/uL (ref 850–3900)
Lymphocytes Relative: 40 %
MCH: 30 pg (ref 27.0–33.0)
MCHC: 32.1 g/dL (ref 32.0–36.0)
MCV: 93.5 fL (ref 80.0–100.0)
MONOS PCT: 6 %
MPV: 10.2 fL (ref 7.5–12.5)
Monocytes Absolute: 462 cells/uL (ref 200–950)
NEUTROS ABS: 4004 {cells}/uL (ref 1500–7800)
Neutrophils Relative %: 52 %
PLATELETS: 287 10*3/uL (ref 140–400)
RBC: 4.46 MIL/uL (ref 3.80–5.10)
RDW: 13.9 % (ref 11.0–15.0)
WBC: 7.7 10*3/uL (ref 3.8–10.8)

## 2015-12-17 LAB — COMPLETE METABOLIC PANEL WITH GFR
ALT: 7 U/L (ref 6–29)
AST: 17 U/L (ref 10–35)
Albumin: 4 g/dL (ref 3.6–5.1)
Alkaline Phosphatase: 47 U/L (ref 33–130)
BILIRUBIN TOTAL: 0.4 mg/dL (ref 0.2–1.2)
BUN: 16 mg/dL (ref 7–25)
CHLORIDE: 105 mmol/L (ref 98–110)
CO2: 25 mmol/L (ref 20–31)
CREATININE: 1.24 mg/dL — AB (ref 0.60–0.88)
Calcium: 9.5 mg/dL (ref 8.6–10.4)
GFR, EST AFRICAN AMERICAN: 47 mL/min — AB (ref >=60)
GFR, Est Non African American: 41 mL/min — ABNORMAL LOW (ref >=60)
GLUCOSE: 94 mg/dL (ref 70–99)
Potassium: 4.7 mmol/L (ref 3.5–5.3)
SODIUM: 140 mmol/L (ref 135–146)
TOTAL PROTEIN: 7.4 g/dL (ref 6.1–8.1)

## 2015-12-17 LAB — LIPID PANEL
Cholesterol: 305 mg/dL — ABNORMAL HIGH (ref <200)
HDL: 53 mg/dL (ref >50)
LDL CALC: 231 mg/dL — AB
Total CHOL/HDL Ratio: 5.8 Ratio — ABNORMAL HIGH (ref <5.0)
Triglycerides: 104 mg/dL (ref <150)
VLDL: 21 mg/dL (ref <30)

## 2015-12-17 NOTE — Progress Notes (Signed)
Subjective:    Patient ID: Bonnie Bennett, female    DOB: Oct 09, 1931, 82 y.o.   MRN: [id number]  HPI  01/21/14  As a very sweet 82 year old African-American female who is also extremely headstrong. She has poorly controlled hypertension and some of the worst LDL cholesterol numbers I have ever seen. She is not taking amlodipine. She is not taking Lotensin HCTZ. She is taking metoprolol. Her noncompliance of medication is reflected in her blood pressure of 178/80. I last checked her cholesterol in March. At that time her LDL cholesterol was 280. Patient refused Lipitor. She took Crestor for a few days and then discontinued it. She refuses any further medication for blood pressure. She refuses any further medication for her cholesterol. She refuses a mammogram. She refuses a bone density. She has a history of a hysterectomy and therefore does not require a Pap smear. Due to her age I do not think going cancer screening is necessary take regular her uncontrolled risk factors for heart disease.   She is due today for her flu shot as well as Prevnar 13.  At that time, my plan was: Patient refuses every one of my recommendations. She will not treat her blood pressure. She will not treat her cholesterol. She will consent to a flu shot and Prevnar 13. She refuses a mammogram. She refuses a bone density. She refuses a colonoscopy. I did my best to explain the risk and benefit of treating her conditions and the patient is willing to except the risk and does not want to treat her blood pressure or cholesterol against my medical advice. I will be glad to help this very nice lady whenever she wants my help.,  06/18/15 She has not been taking any of her medication.[name]. Her blood pressure is extremely high today. She denies any chest pain shortness of breath dyspnea on exertion. She denies any orthopnea or paroxysmal nocturnal dyspnea. She has been having occasional dull headaches. She denies any dizziness.  At that  time, my plan was: Blood pressure is out of control. She refuses take metoprolol also I will start her on Zestoretic 20/25 one by mouth daily and recheck her blood pressure in 2 weeks. I will also check a fasting lipid panel. The last time I checked her cholesterol it was terrible however she refused any statin medication to treat it.  07/02/15 Blood pressure today is much better. However inexplicably, the patient picked up the lisinopril but never started taking it. I verified with the pharmacist that she picked up the medication. However the patient is adamant that she is not taking it. She states that she is only taking metoprolol. Her blood pressure today is excellent. It showed a lab work below her cholesterol was terrible but she continues to refuse statin medication.  At that time, my plan was: Blood pressure is now controlled on metoprolol. Discontinue lisinopril and continue only metoprolol. Patient refuses statin for hyperlipidemia. I admitted to her I am a little frustrated. She is a very sweet lady but I cannot seem to explain how dangerous her cholesterol is. I will do whatever I can help her but she refuses cholesterol medication and wants to continue Metamucil.  12/17/15 Here for follow up.  Overall patient is doing very well. She continues to refuse statin medications for her cholesterol. We have discussed this at length. Today on her exam there is a coincidental finding of bilateral carotid bruits. I believe this is due to a faint murmur I'm hearing  over her aortic valve although I cannot rule out carotid stenosis. I discussed this with the patient but she adamantly refuses carotid Dopplers Past Medical History:  Diagnosis Date  . HLD (hyperlipidemia)   . Hypertension    Past Surgical History:  Procedure Laterality Date  . ABDOMINAL HYSTERECTOMY     Current Outpatient Prescriptions on File Prior to Visit  Medication Sig Dispense Refill  . aspirin EC 81 MG tablet Take 81 mg by mouth  daily. Reported on 06/12/2015    . lisinopril-hydrochlorothiazide (PRINZIDE,ZESTORETIC) 20-25 MG tablet Take 1 tablet by mouth daily. 90 tablet 3  . meclizine (ANTIVERT) 25 MG tablet Take 1 tablet (25 mg total) by mouth 3 (three) times daily as needed for dizziness. 30 tablet 0  . metoprolol succinate (TOPROL-XL) 25 MG 24 hr tablet TAKE 1 TABLET EVERY DAY 90 tablet 2  . Multiple Vitamin (MULTIVITAMIN) tablet Take 1 tablet by mouth daily.     No current facility-administered medications on file prior to visit.    No Known Allergies Social History   Social History  . Marital status: Widowed    Spouse name: N/A  . Number of children: N/A  . Years of education: N/A   Occupational History  . Not on file.   Social History Main Topics  . Smoking status: Never Smoker  . Smokeless tobacco: Not on file  . Alcohol use Yes     Comment: occasional  . Drug use: No  . Sexual activity: Not on file   Other Topics Concern  . Not on file   Social History Narrative  . No narrative on file   No family history on file.    Review of Systems  All other systems reviewed and are negative.      Objective:   Physical Exam  Constitutional: She appears well-developed and well-nourished. No distress.  Neck: No JVD present. No thyromegaly present.  Cardiovascular: Normal rate and regular rhythm.  Exam reveals no gallop and no friction rub.   Murmur heard. Pulses:      Carotid pulses are on the right side with bruit, and on the left side with bruit. Pulmonary/Chest: Effort normal and breath sounds normal. She has no wheezes. She has no rales.  Psychiatric: She has a normal mood and affect. Her behavior is normal. Judgment and thought content normal.  Vitals reviewed.     Assessment & Plan:  Benign essential HTN - Plan: COMPLETE METABOLIC PANEL WITH GFR, Lipid panel, CBC with Differential/Platelet  Pure hypercholesterolemia - Plan: COMPLETE METABOLIC PANEL WITH GFR, Lipid panel, CBC with  Differential/Platelet  Needs flu shot - Plan: Flu Vaccine QUAD 36+ mos IM  I believe that the sounds I'm hearing on her exam or transmitted sounds due to her cardiac murmur and not true carotid stenosis. I encouraged her to get carotid Dopplers but she declines. She will receive her flu shot today. Her blood pressure is outstanding.

## 2016-04-20 DIAGNOSIS — Z6841 Body Mass Index (BMI) 40.0 and over, adult: Secondary | ICD-10-CM | POA: Diagnosis not present

## 2016-04-20 DIAGNOSIS — M130 Polyarthritis, unspecified: Secondary | ICD-10-CM | POA: Diagnosis not present

## 2016-04-20 DIAGNOSIS — Z87891 Personal history of nicotine dependence: Secondary | ICD-10-CM | POA: Diagnosis not present

## 2016-04-20 DIAGNOSIS — H259 Unspecified age-related cataract: Secondary | ICD-10-CM | POA: Diagnosis not present

## 2016-04-20 DIAGNOSIS — M47819 Spondylosis without myelopathy or radiculopathy, site unspecified: Secondary | ICD-10-CM | POA: Diagnosis not present

## 2016-04-20 DIAGNOSIS — M47812 Spondylosis without myelopathy or radiculopathy, cervical region: Secondary | ICD-10-CM | POA: Diagnosis not present

## 2016-04-20 DIAGNOSIS — Z0000 Encounter for general adult medical examination without abnormal findings: Secondary | ICD-10-CM | POA: Diagnosis not present

## 2016-04-20 DIAGNOSIS — K08409 Partial loss of teeth, unspecified cause, unspecified class: Secondary | ICD-10-CM | POA: Diagnosis not present

## 2016-04-20 DIAGNOSIS — Z972 Presence of dental prosthetic device (complete) (partial): Secondary | ICD-10-CM | POA: Diagnosis not present

## 2016-04-20 DIAGNOSIS — I10 Essential (primary) hypertension: Secondary | ICD-10-CM | POA: Diagnosis not present

## 2016-04-20 DIAGNOSIS — Z96653 Presence of artificial knee joint, bilateral: Secondary | ICD-10-CM | POA: Diagnosis not present

## 2016-04-20 DIAGNOSIS — E785 Hyperlipidemia, unspecified: Secondary | ICD-10-CM | POA: Diagnosis not present

## 2016-05-30 ENCOUNTER — Encounter: Payer: Self-pay | Admitting: Family Medicine

## 2016-05-30 ENCOUNTER — Ambulatory Visit (INDEPENDENT_AMBULATORY_CARE_PROVIDER_SITE_OTHER): Payer: Medicare HMO | Admitting: Family Medicine

## 2016-05-30 VITALS — BP 134/78 | HR 80 | Temp 98.2°F | Resp 18 | Ht 63.0 in | Wt 229.0 lb

## 2016-05-30 DIAGNOSIS — H259 Unspecified age-related cataract: Secondary | ICD-10-CM

## 2016-05-30 DIAGNOSIS — I10 Essential (primary) hypertension: Secondary | ICD-10-CM

## 2016-05-30 DIAGNOSIS — E7800 Pure hypercholesterolemia, unspecified: Secondary | ICD-10-CM

## 2016-05-30 DIAGNOSIS — Z0000 Encounter for general adult medical examination without abnormal findings: Secondary | ICD-10-CM

## 2016-05-30 LAB — CBC WITH DIFFERENTIAL/PLATELET
BASOS PCT: 0 %
Basophils Absolute: 0 cells/uL (ref 0–200)
EOS ABS: 86 {cells}/uL (ref 15–500)
Eosinophils Relative: 1 %
HEMATOCRIT: 42.3 % (ref 35.0–45.0)
Hemoglobin: 13.5 g/dL (ref 12.0–15.0)
LYMPHS ABS: 3440 {cells}/uL (ref 850–3900)
LYMPHS PCT: 40 %
MCH: 29.3 pg (ref 27.0–33.0)
MCHC: 31.9 g/dL — ABNORMAL LOW (ref 32.0–36.0)
MCV: 91.8 fL (ref 80.0–100.0)
MONO ABS: 602 {cells}/uL (ref 200–950)
MPV: 10.1 fL (ref 7.5–12.5)
Monocytes Relative: 7 %
NEUTROS ABS: 4472 {cells}/uL (ref 1500–7800)
Neutrophils Relative %: 52 %
PLATELETS: 284 10*3/uL (ref 140–400)
RBC: 4.61 MIL/uL (ref 3.80–5.10)
RDW: 14.3 % (ref 11.0–15.0)
WBC: 8.6 10*3/uL (ref 3.8–10.8)

## 2016-05-30 LAB — LIPID PANEL
Cholesterol: 343 mg/dL — ABNORMAL HIGH (ref <200)
HDL: 59 mg/dL (ref >50)
LDL CALC: 264 mg/dL — AB (ref <100)
Total CHOL/HDL Ratio: 5.8 Ratio — ABNORMAL HIGH (ref <5.0)
Triglycerides: 102 mg/dL (ref <150)
VLDL: 20 mg/dL (ref <30)

## 2016-05-30 LAB — COMPLETE METABOLIC PANEL WITH GFR
ALT: 7 U/L (ref 6–29)
AST: 17 U/L (ref 10–35)
Albumin: 4.2 g/dL (ref 3.6–5.1)
Alkaline Phosphatase: 50 U/L (ref 33–130)
BILIRUBIN TOTAL: 0.4 mg/dL (ref 0.2–1.2)
BUN: 21 mg/dL (ref 7–25)
CALCIUM: 9.8 mg/dL (ref 8.6–10.4)
CHLORIDE: 107 mmol/L (ref 98–110)
CO2: 22 mmol/L (ref 20–31)
CREATININE: 1.13 mg/dL — AB (ref 0.60–0.88)
GFR, EST AFRICAN AMERICAN: 52 mL/min — AB (ref >=60)
GFR, Est Non African American: 45 mL/min — ABNORMAL LOW (ref >=60)
Glucose, Bld: 104 mg/dL — ABNORMAL HIGH (ref 70–99)
Potassium: 5.2 mmol/L (ref 3.5–5.3)
Sodium: 142 mmol/L (ref 135–146)
TOTAL PROTEIN: 7.7 g/dL (ref 6.1–8.1)

## 2016-05-30 NOTE — Progress Notes (Signed)
Subjective:    Patient ID: Bonnie Bennett, female    DOB: Apr 10, 1931, 83 y.o.   MRN: [id number]  HPI  She is a very sweet 83 year old African-American female who is here for CPE.  PMH is significant for htn and HLD but she refuses all treatment for her cholesterol.  She refuses a mammogram today.  She refuses DEXA.  Adament she had pneumovax 23 in past.  Prevnar UTD.   Refuses shingles shot.  Does not require a Pap smear. Does not require a colonoscopy because of age. Declines a tetanus shot denies any problems with memory loss, falls, or depression. Past Medical History:  Diagnosis Date  . HLD (hyperlipidemia)   . Hypertension    Past Surgical History:  Procedure Laterality Date  . ABDOMINAL HYSTERECTOMY     Current Outpatient Prescriptions on File Prior to Visit  Medication Sig Dispense Refill  . aspirin EC 81 MG tablet Take 81 mg by mouth daily. Reported on 06/12/2015    . lisinopril-hydrochlorothiazide (PRINZIDE,ZESTORETIC) 20-25 MG tablet Take 1 tablet by mouth daily. 90 tablet 3  . Multiple Vitamin (MULTIVITAMIN) tablet Take 1 tablet by mouth daily.     No current facility-administered medications on file prior to visit.    No Known Allergies Social History   Social History  . Marital status: Widowed    Spouse name: N/A  . Number of children: N/A  . Years of education: N/A   Occupational History  . Not on file.   Social History Main Topics  . Smoking status: Never Smoker  . Smokeless tobacco: Never Used  . Alcohol use Yes     Comment: occasional  . Drug use: No  . Sexual activity: Not on file   Other Topics Concern  . Not on file   Social History Narrative  . No narrative on file   No family history on file.    Review of Systems  All other systems reviewed and are negative.      Objective:   Physical Exam  Constitutional: She is oriented to person, place, and time. She appears well-developed and well-nourished. No distress.  HENT:  Head:  Normocephalic and atraumatic.  Right Ear: External ear normal.  Left Ear: External ear normal.  Nose: Nose normal.  Mouth/Throat: Oropharynx is clear and moist. No oropharyngeal exudate.  Eyes: Conjunctivae and EOM are normal. Pupils are equal, round, and reactive to light.  Neck: Normal range of motion. Neck supple. No JVD present. No thyromegaly present.  Cardiovascular: Normal rate, regular rhythm and normal heart sounds.  Exam reveals no gallop and no friction rub.   No murmur heard. Pulmonary/Chest: Effort normal and breath sounds normal. No respiratory distress. She has no wheezes. She has no rales.  Abdominal: Soft. Bowel sounds are normal. She exhibits no distension and no mass. There is no tenderness. There is no rebound and no guarding.  Musculoskeletal: Normal range of motion. She exhibits no edema or tenderness.  Lymphadenopathy:    She has no cervical adenopathy.  Neurological: She is alert and oriented to person, place, and time. She has normal reflexes. No cranial nerve deficit. She exhibits normal muscle tone. Coordination normal.  Skin: Skin is warm. No rash noted. No erythema. No pallor.  Psychiatric: She has a normal mood and affect. Her behavior is normal. Judgment and thought content normal.  Vitals reviewed.   Significant bilateral cataracts     Assessment & Plan:  Benign essential HTN - Plan: CBC with Differential/Platelet,  COMPLETE METABOLIC PANEL WITH GFR, Lipid panel  Pure hypercholesterolemia  General medical exam  Age-related cataract of both eyes, unspecified age-related cataract type - Plan: Ambulatory referral to Ophthalmology   Patient refuses mammogram and DEXA.  She will allow me to schedule her to see an eye doctor for evaluation for cataracts. Fortunately her blood pressures well controlled today. She continues to refuse treatment for her hyperlipidemia. She declines the shingles vaccine today as well as a tetanus shot.

## 2016-07-25 ENCOUNTER — Other Ambulatory Visit: Payer: Self-pay | Admitting: Family Medicine

## 2016-07-28 DIAGNOSIS — H5203 Hypermetropia, bilateral: Secondary | ICD-10-CM | POA: Diagnosis not present

## 2016-07-28 DIAGNOSIS — H2513 Age-related nuclear cataract, bilateral: Secondary | ICD-10-CM | POA: Diagnosis not present

## 2016-07-28 DIAGNOSIS — H25013 Cortical age-related cataract, bilateral: Secondary | ICD-10-CM | POA: Diagnosis not present

## 2016-08-29 ENCOUNTER — Telehealth: Payer: Self-pay | Admitting: *Deleted

## 2016-08-29 NOTE — Telephone Encounter (Signed)
Pt is having cataract surgery 7/25 with Dr. Nile RiggsShapiro, has been out of town and wanted to know if there are any instructions about medicine before going. She has been taking ibuprofen for Lt hip pain which started end of June while walking [place] and then reoccurred 7/17 while loading washing machine. This comes and goes.  Phoned Dr. Lenard LanceShapiros office and asked if someone could let pt know of any restrictions for surgery and if the ibuprofen is okay. Also offered pt an appt for the hip pain and she stated wanted to give it more time and would call if pain continues. Asked pt to call me back if has not heard from SiloShapiros office by end of day.

## 2016-08-31 DIAGNOSIS — H2512 Age-related nuclear cataract, left eye: Secondary | ICD-10-CM | POA: Diagnosis not present

## 2016-08-31 DIAGNOSIS — H25012 Cortical age-related cataract, left eye: Secondary | ICD-10-CM | POA: Diagnosis not present

## 2017-01-25 DIAGNOSIS — M5136 Other intervertebral disc degeneration, lumbar region: Secondary | ICD-10-CM | POA: Diagnosis not present

## 2017-01-25 DIAGNOSIS — M545 Low back pain: Secondary | ICD-10-CM | POA: Diagnosis not present

## 2017-04-27 ENCOUNTER — Other Ambulatory Visit: Payer: Self-pay | Admitting: Family Medicine

## 2017-04-27 MED ORDER — LISINOPRIL-HYDROCHLOROTHIAZIDE 20-25 MG PO TABS: 1.0000 | ORAL_TABLET | Freq: Every day | ORAL | 2 refills | Status: DC

## 2017-05-15 DIAGNOSIS — Z6841 Body Mass Index (BMI) 40.0 and over, adult: Secondary | ICD-10-CM | POA: Diagnosis not present

## 2017-05-15 DIAGNOSIS — R69 Illness, unspecified: Secondary | ICD-10-CM | POA: Diagnosis not present

## 2017-05-15 DIAGNOSIS — Z791 Long term (current) use of non-steroidal anti-inflammatories (NSAID): Secondary | ICD-10-CM | POA: Diagnosis not present

## 2017-05-15 DIAGNOSIS — G8929 Other chronic pain: Secondary | ICD-10-CM | POA: Diagnosis not present

## 2017-05-15 DIAGNOSIS — H269 Unspecified cataract: Secondary | ICD-10-CM | POA: Diagnosis not present

## 2017-05-15 DIAGNOSIS — R269 Unspecified abnormalities of gait and mobility: Secondary | ICD-10-CM | POA: Diagnosis not present

## 2017-05-15 DIAGNOSIS — I10 Essential (primary) hypertension: Secondary | ICD-10-CM | POA: Diagnosis not present

## 2017-05-15 DIAGNOSIS — K08109 Complete loss of teeth, unspecified cause, unspecified class: Secondary | ICD-10-CM | POA: Diagnosis not present

## 2017-05-15 DIAGNOSIS — K5900 Constipation, unspecified: Secondary | ICD-10-CM | POA: Diagnosis not present

## 2017-11-24 DIAGNOSIS — R69 Illness, unspecified: Secondary | ICD-10-CM | POA: Diagnosis not present

## 2018-01-26 ENCOUNTER — Other Ambulatory Visit: Payer: Self-pay | Admitting: Family Medicine

## 2018-08-08 ENCOUNTER — Other Ambulatory Visit: Payer: Self-pay | Admitting: Family Medicine

## 2018-08-08 MED ORDER — LISINOPRIL 20 MG PO TABS: 20.0000 mg | ORAL_TABLET | Freq: Every day | ORAL | 3 refills | Status: DC

## 2018-08-08 MED ORDER — HYDROCHLOROTHIAZIDE 25 MG PO TABS: 25.0000 mg | ORAL_TABLET | Freq: Every day | ORAL | 3 refills | Status: DC

## 2018-10-31 DIAGNOSIS — R69 Illness, unspecified: Secondary | ICD-10-CM | POA: Diagnosis not present

## 2018-11-04 ENCOUNTER — Other Ambulatory Visit: Payer: Self-pay | Admitting: Family Medicine

## 2019-01-25 DIAGNOSIS — Z87891 Personal history of nicotine dependence: Secondary | ICD-10-CM | POA: Diagnosis not present

## 2019-01-25 DIAGNOSIS — R32 Unspecified urinary incontinence: Secondary | ICD-10-CM | POA: Diagnosis not present

## 2019-01-25 DIAGNOSIS — H269 Unspecified cataract: Secondary | ICD-10-CM | POA: Diagnosis not present

## 2019-01-25 DIAGNOSIS — Z008 Encounter for other general examination: Secondary | ICD-10-CM | POA: Diagnosis not present

## 2019-01-25 DIAGNOSIS — I739 Peripheral vascular disease, unspecified: Secondary | ICD-10-CM | POA: Diagnosis not present

## 2019-01-25 DIAGNOSIS — I10 Essential (primary) hypertension: Secondary | ICD-10-CM | POA: Diagnosis not present

## 2019-01-25 DIAGNOSIS — I499 Cardiac arrhythmia, unspecified: Secondary | ICD-10-CM | POA: Diagnosis not present

## 2019-01-25 DIAGNOSIS — G8929 Other chronic pain: Secondary | ICD-10-CM | POA: Diagnosis not present

## 2019-01-25 DIAGNOSIS — Z7982 Long term (current) use of aspirin: Secondary | ICD-10-CM | POA: Diagnosis not present

## 2019-01-30 ENCOUNTER — Other Ambulatory Visit: Payer: Self-pay | Admitting: Family Medicine

## 2019-02-05 ENCOUNTER — Ambulatory Visit (INDEPENDENT_AMBULATORY_CARE_PROVIDER_SITE_OTHER): Payer: Medicare HMO | Admitting: Family Medicine

## 2019-02-05 ENCOUNTER — Other Ambulatory Visit: Payer: Self-pay

## 2019-02-05 VITALS — BP 138/80 | HR 76 | Temp 97.7°F | Resp 18 | Ht 63.0 in | Wt 233.0 lb

## 2019-02-05 DIAGNOSIS — Z13220 Encounter for screening for lipoid disorders: Secondary | ICD-10-CM | POA: Diagnosis not present

## 2019-02-05 DIAGNOSIS — R7309 Other abnormal glucose: Secondary | ICD-10-CM | POA: Diagnosis not present

## 2019-02-05 DIAGNOSIS — R079 Chest pain, unspecified: Secondary | ICD-10-CM | POA: Diagnosis not present

## 2019-02-05 DIAGNOSIS — G609 Hereditary and idiopathic neuropathy, unspecified: Secondary | ICD-10-CM | POA: Diagnosis not present

## 2019-02-05 NOTE — Progress Notes (Signed)
Subjective:    Patient ID: Bonnie Bennett, female    DOB: 01-13-1932, 85 y.o.   MRN: [id number]  HPI Patient initially made the appointment today because she was seen at home by her insurance company and was told that she had poor circulation in her legs.  Patient has not been seen in more than 2 years by this provider.  She has a history of hypertension.  She refuses any statin for hyperlipidemia.  Her blood pressure today is well controlled at 138/80 however she reports that over the last 3 weeks she has developed left-sided chest pain.  The pain is nonexertional.  She denies any shortness of breath with activity.  However recently, the pain was so severe, 6 on a scale of 1-10 in, which she considered going to the hospital.  However the pain improved and she did not.  She denies any shortness of breath with activity.  She denies any chest pain with activity such as walking or going up and down stairs.  She denies any chest pain lifting objects or physical exertion.  The pain tends to occur more at night.  It is located in her left breast.  She denies any cough.  She denies any pleurisy.  She denies any hemoptysis.  She denies any shortness of breath, orthopnea, or paroxysmal nocturnal dyspnea.  EKG today in the office shows normal sinus rhythm with normal intervals and a normal axis and no evidence of ischemia or infarction.  She denies any claudication in her legs however she does report numbness and tingling in her toes which sounds more consistent with peripheral neuropathy Past Medical History:  Diagnosis Date  . HLD (hyperlipidemia)   . Hypertension    Current Outpatient Medications on File Prior to Visit  Medication Sig Dispense Refill  . aspirin EC 81 MG tablet Take 81 mg by mouth daily. Reported on 06/12/2015    . lisinopril-hydrochlorothiazide (ZESTORETIC) 20-25 MG tablet TAKE 1 TABLET BY MOUTH EVERY DAY 30 tablet 0  . Multiple Vitamin (MULTIVITAMIN) tablet Take 1 tablet by mouth  daily.     No current facility-administered medications on file prior to visit.   No Known Allergies Social History   Socioeconomic History  . Marital status: Widowed    Spouse name: Not on file  . Number of children: Not on file  . Years of education: Not on file  . Highest education level: Not on file  Occupational History  . Not on file  Tobacco Use  . Smoking status: Never Smoker  . Smokeless tobacco: Never Used  Substance and Sexual Activity  . Alcohol use: Yes    Comment: occasional  . Drug use: No  . Sexual activity: Not on file  Other Topics Concern  . Not on file  Social History Narrative  . Not on file   Social Determinants of Health   Financial Resource Strain:   . Difficulty of Paying Living Expenses: Not on file  Food Insecurity:   . Worried About Programme researcher, broadcasting/film/video in the Last Year: Not on file  . Ran Out of Food in the Last Year: Not on file  Transportation Needs:   . Lack of Transportation (Medical): Not on file  . Lack of Transportation (Non-Medical): Not on file  Physical Activity:   . Days of Exercise per Week: Not on file  . Minutes of Exercise per Session: Not on file  Stress:   . Feeling of Stress : Not on file  Social  Connections:   . Frequency of Communication with Friends and Family: Not on file  . Frequency of Social Gatherings with Friends and Family: Not on file  . Attends Religious Services: Not on file  . Active Member of Clubs or Organizations: Not on file  . Attends Archivist Meetings: Not on file  . Marital Status: Not on file  Intimate Partner Violence:   . Fear of Current or Ex-Partner: Not on file  . Emotionally Abused: Not on file  . Physically Abused: Not on file  . Sexually Abused: Not on file      Review of Systems     Objective:   Physical Exam Constitutional:      General: She is not in acute distress.    Appearance: Normal appearance. She is normal weight. She is not ill-appearing or  toxic-appearing.  Cardiovascular:     Rate and Rhythm: Normal rate.     Heart sounds: Normal heart sounds. No murmur. No friction rub. No gallop.   Pulmonary:     Effort: Pulmonary effort is normal. No respiratory distress.     Breath sounds: Normal breath sounds. No stridor. No wheezing, rhonchi or rales.  Chest:     Chest wall: No tenderness.  Abdominal:     General: Abdomen is flat. Bowel sounds are normal. There is no distension.     Palpations: Abdomen is soft.     Tenderness: There is no abdominal tenderness. There is no guarding.  Musculoskeletal:     Right lower leg: No edema.     Left lower leg: No edema.  Neurological:     Mental Status: She is alert.           Assessment & Plan:  Chest pain, unspecified type - Plan: EKG 12-Lead, EKG 12-Lead, DG Chest 2 View  Idiopathic peripheral neuropathy - Plan: CBC with Differential, COMPLETE METABOLIC PANEL WITH GFR, Lipid Panel, TSH, Hemoglobin A1c, Vitamin B12  Patient's chest pain is very atypical and EKG is reassuring however she has numerous risk factors for coronary artery disease including age, obesity, hypertension, and on controlled hyperlipidemia.  Therefore I recommended a cardiology consultation for a stress test.  I would also obtain a chest x-ray as part of the work-up for her chest pain.  Check fasting lab work including a CBC, CMP, fasting lipid panel to gauge her cardiac risk factors.  Given the peripheral neuropathy I will also check a TSH, vitamin B12, and hemoglobin A1c.  If the patient develops chest pain again, she has been pain-free now for the last 4 days, I have directed her to go immediately to the hospital.  She agrees.  Otherwise arrange outpatient cardiology consultation.

## 2019-02-06 ENCOUNTER — Ambulatory Visit
Admission: RE | Admit: 2019-02-06 | Discharge: 2019-02-06 | Disposition: A | Discharge status: Home / Self Care | Payer: Medicare HMO | Source: Ambulatory Visit | Attending: Family Medicine | Admitting: Family Medicine

## 2019-02-06 DIAGNOSIS — R079 Chest pain, unspecified: Secondary | ICD-10-CM

## 2019-02-06 LAB — COMPLETE METABOLIC PANEL WITH GFR
AG Ratio: 1.3 (calc) (ref 1.0–2.5)
ALT: 6 U/L (ref 6–29)
AST: 14 U/L (ref 10–35)
Albumin: 4.4 g/dL (ref 3.6–5.1)
Alkaline phosphatase (APISO): 52 U/L (ref 37–153)
BUN/Creatinine Ratio: 13 (calc) (ref 6–22)
BUN: 15 mg/dL (ref 7–25)
CO2: 26 mmol/L (ref 20–32)
Calcium: 10 mg/dL (ref 8.6–10.4)
Chloride: 106 mmol/L (ref 98–110)
Creat: 1.12 mg/dL — ABNORMAL HIGH (ref 0.60–0.88)
GFR, Est African American: 52 mL/min/{1.73_m2} — ABNORMAL LOW (ref >=60)
GFR, Est Non African American: 45 mL/min/{1.73_m2} — ABNORMAL LOW (ref >=60)
Globulin: 3.3 g/dL (calc) (ref 1.9–3.7)
Glucose, Bld: 84 mg/dL (ref 65–99)
Potassium: 5 mmol/L (ref 3.5–5.3)
Sodium: 143 mmol/L (ref 135–146)
Total Bilirubin: 0.5 mg/dL (ref 0.2–1.2)
Total Protein: 7.7 g/dL (ref 6.1–8.1)

## 2019-02-06 LAB — CBC WITH DIFFERENTIAL/PLATELET
Absolute Monocytes: 676 cells/uL (ref 200–950)
Basophils Absolute: 53 cells/uL (ref 0–200)
Basophils Relative: 0.6 %
Eosinophils Absolute: 89 cells/uL (ref 15–500)
Eosinophils Relative: 1 %
HCT: 42.1 % (ref 35.0–45.0)
Hemoglobin: 13.6 g/dL (ref 11.7–15.5)
Lymphs Abs: 3605 cells/uL (ref 850–3900)
MCH: 29.2 pg (ref 27.0–33.0)
MCHC: 32.3 g/dL (ref 32.0–36.0)
MCV: 90.5 fL (ref 80.0–100.0)
MPV: 10.7 fL (ref 7.5–12.5)
Monocytes Relative: 7.6 %
Neutro Abs: 4477 cells/uL (ref 1500–7800)
Neutrophils Relative %: 50.3 %
Platelets: 257 10*3/uL (ref 140–400)
RBC: 4.65 10*6/uL (ref 3.80–5.10)
RDW: 13.1 % (ref 11.0–15.0)
Total Lymphocyte: 40.5 %
WBC: 8.9 10*3/uL (ref 3.8–10.8)

## 2019-02-06 LAB — HEMOGLOBIN A1C
Hgb A1c MFr Bld: 6.1 % of total Hgb — ABNORMAL HIGH (ref <5.7)
Mean Plasma Glucose: 128 (calc)
eAG (mmol/L): 7.1 (calc)

## 2019-02-06 LAB — VITAMIN B12: Vitamin B-12: 346 pg/mL (ref 200–1100)

## 2019-02-06 LAB — LIPID PANEL
Cholesterol: 330 mg/dL — ABNORMAL HIGH (ref <200)
HDL: 59 mg/dL (ref >=50)
LDL Cholesterol (Calc): 244 mg/dL (calc) — ABNORMAL HIGH
Non-HDL Cholesterol (Calc): 271 mg/dL (calc) — ABNORMAL HIGH (ref <130)
Total CHOL/HDL Ratio: 5.6 (calc) — ABNORMAL HIGH (ref <5.0)
Triglycerides: 125 mg/dL (ref <150)

## 2019-02-06 LAB — TSH: TSH: 1.55 mIU/L (ref 0.40–4.50)

## 2019-02-07 ENCOUNTER — Other Ambulatory Visit: Payer: Self-pay | Admitting: Family Medicine

## 2019-02-07 MED ORDER — ROSUVASTATIN CALCIUM 20 MG PO TABS: 20.0000 mg | ORAL_TABLET | Freq: Every day | ORAL | 1 refills | Status: DC

## 2019-02-26 ENCOUNTER — Other Ambulatory Visit: Payer: Self-pay | Admitting: Family Medicine

## 2019-03-14 ENCOUNTER — Encounter: Payer: Self-pay | Admitting: Cardiology

## 2019-03-14 ENCOUNTER — Other Ambulatory Visit: Payer: Self-pay

## 2019-03-14 ENCOUNTER — Ambulatory Visit (INDEPENDENT_AMBULATORY_CARE_PROVIDER_SITE_OTHER): Payer: Medicare HMO | Admitting: Cardiology

## 2019-03-14 ENCOUNTER — Ambulatory Visit: Payer: Medicare HMO | Admitting: Cardiology

## 2019-03-14 VITALS — BP 209/77 | HR 81 | Temp 97.6°F | Ht 63.0 in | Wt 231.0 lb

## 2019-03-14 DIAGNOSIS — I10 Essential (primary) hypertension: Secondary | ICD-10-CM

## 2019-03-14 DIAGNOSIS — E7800 Pure hypercholesterolemia, unspecified: Secondary | ICD-10-CM

## 2019-03-14 DIAGNOSIS — R0989 Other specified symptoms and signs involving the circulatory and respiratory systems: Secondary | ICD-10-CM | POA: Diagnosis not present

## 2019-03-14 DIAGNOSIS — R0789 Other chest pain: Secondary | ICD-10-CM

## 2019-03-14 DIAGNOSIS — I358 Other nonrheumatic aortic valve disorders: Secondary | ICD-10-CM | POA: Diagnosis not present

## 2019-03-14 MED ORDER — ATORVASTATIN CALCIUM 10 MG PO TABS: 10.0000 mg | ORAL_TABLET | Freq: Every day | ORAL | 2 refills | Status: DC

## 2019-03-14 MED ORDER — AMLODIPINE BESYLATE 5 MG PO TABS: 5.0000 mg | ORAL_TABLET | Freq: Every day | ORAL | 2 refills | Status: DC

## 2019-03-14 NOTE — Progress Notes (Signed)
Primary Physician:  Donita Brooks, MD   Patient ID: Bonnie Bennett, female    DOB: 1931/09/15, 86 y.o.   MRN: [id number]  Subjective:    Chief Complaint  Patient presents with  . Chest Pain  . New Patient (Initial Visit)    HPI: Bonnie Bennett  is a 86 y.o. female  with hypertension, hyperlipidemia, former tobacco use, referred to [place] for evaluation of chest pain.  She reports that 1 month ago, she developed pain under her left breast that she describes as soreness. States that this resolved after a few days and has not recurred. She admits to moving some wooden pallets prior to this. She is fairly active with maintaining her own house. She does have dyspnea on exertion if she over exerts herself.  She has tried several statins in the past and did not tolerate well. She is not very careful with her diet. States that her blood pressure is well controlled. She has had a couple of days of headaches lately and feels like her blood pressure may be up at that time.   She had 9 siblings, all of which are deceased. States that she had brother had sudden cardiac death in his 25's. Another brother was born with a cardiac condition. She has 3 sons and 4 daughters.  Past Medical History:  Diagnosis Date  . HLD (hyperlipidemia)   . Hypertension       Social History   Socioeconomic History  . Marital status: Widowed    Spouse name: Not on file  . Number of children: 7  . Years of education: Not on file  . Highest education level: Not on file  Occupational History  . Not on file  Tobacco Use  . Smoking status: Former Smoker    Packs/day: 0.25    Years: 45.00    Pack years: 11.25    Types: Cigarettes    Quit date: 1990    Years since quitting: 31.1  . Smokeless tobacco: Never Used  Substance and Sexual Activity  . Alcohol use: Yes    Comment: occasional  . Drug use: No  . Sexual activity: Not on file  Other Topics Concern  . Not on file  Social History Narrative   . Not on file   Social Determinants of Health   Financial Resource Strain:   . Difficulty of Paying Living Expenses: Not on file  Food Insecurity:   . Worried About Programme researcher, broadcasting/film/video in the Last Year: Not on file  . Ran Out of Food in the Last Year: Not on file  Transportation Needs:   . Lack of Transportation (Medical): Not on file  . Lack of Transportation (Non-Medical): Not on file  Physical Activity:   . Days of Exercise per Week: Not on file  . Minutes of Exercise per Session: Not on file  Stress:   . Feeling of Stress : Not on file  Social Connections:   . Frequency of Communication with Friends and Family: Not on file  . Frequency of Social Gatherings with Friends and Family: Not on file  . Attends Religious Services: Not on file  . Active Member of Clubs or Organizations: Not on file  . Attends Banker Meetings: Not on file  . Marital Status: Not on file  Intimate Partner Violence:   . Fear of Current or Ex-Partner: Not on file  . Emotionally Abused: Not on file  . Physically Abused: Not on file  .  Sexually Abused: Not on file    Review of Systems  Constitution: Negative for decreased appetite, malaise/fatigue, weight gain and weight loss.  Eyes: Negative for visual disturbance.  Cardiovascular: Negative for chest pain, claudication, dyspnea on exertion, leg swelling, orthopnea, palpitations and syncope.  Respiratory: Negative for hemoptysis and wheezing.   Endocrine: Negative for cold intolerance and heat intolerance.  Hematologic/Lymphatic: Does not bruise/bleed easily.  Skin: Negative for nail changes.  Musculoskeletal: Negative for muscle weakness and myalgias.  Gastrointestinal: Negative for abdominal pain, change in bowel habit, nausea and vomiting.  Neurological: Negative for difficulty with concentration, dizziness, focal weakness and headaches.  Psychiatric/Behavioral: Negative for altered mental status and suicidal ideas.  All other  systems reviewed and are negative.     Objective:  Blood pressure (!) 209/77, pulse 81, temperature 97.6 F (36.4 C), height 5\' 3"  (1.6 m), weight 231 lb (104.8 kg), SpO2 98 %. Body mass index is 40.92 kg/m.    Physical Exam  Constitutional: She is oriented to person, place, and time. Vital signs are normal. She appears well-developed and well-nourished.  HENT:  Head: Normocephalic and atraumatic.  Cardiovascular: Normal rate, regular rhythm and intact distal pulses.  Murmur heard.  Harsh midsystolic murmur is present with a grade of 2/6 at the upper right sternal border radiating to the neck. Pulses:      Carotid pulses are on the right side with bruit and on the left side with bruit. Pulmonary/Chest: Effort normal and breath sounds normal. No accessory muscle usage. No respiratory distress.  Abdominal: Soft. Bowel sounds are normal.  Musculoskeletal:        General: Normal range of motion.     Cervical back: Normal range of motion.  Neurological: She is alert and oriented to person, place, and time.  Skin: Skin is warm and dry.  Vitals reviewed.  Radiology: No results found.  Laboratory examination:    CMP Latest Ref Rng & Units 02/05/2019 05/30/2016 12/17/2015  Glucose 65 - 99 mg/dL 84 104(H) 94  BUN 7 - 25 mg/dL 15 21 16   Creatinine 0.60 - 0.88 mg/dL 1.12(H) 1.13(H) 1.24(H)  Sodium 135 - 146 mmol/L 143 142 140  Potassium 3.5 - 5.3 mmol/L 5.0 5.2 4.7  Chloride 98 - 110 mmol/L 106 107 105  CO2 20 - 32 mmol/L 26 22 25   Calcium 8.6 - 10.4 mg/dL 10.0 9.8 9.5  Total Protein 6.1 - 8.1 g/dL 7.7 7.7 7.4  Total Bilirubin 0.2 - 1.2 mg/dL 0.5 0.4 0.4  Alkaline Phos 33 - 130 U/L - 50 47  AST 10 - 35 U/L 14 17 17   ALT 6 - 29 U/L 6 7 7    CBC Latest Ref Rng & Units 02/05/2019 05/30/2016 12/17/2015  WBC 3.8 - 10.8 Thousand/uL 8.9 8.6 7.7  Hemoglobin 11.7 - 15.5 g/dL 13.6 13.5 13.4  Hematocrit 35.0 - 45.0 % 42.1 42.3 41.7  Platelets 140 - 400 Thousand/uL 257 284 287   Lipid Panel      Component Value Date/Time   CHOL 330 (H) 02/05/2019 1202   TRIG 125 02/05/2019 1202   HDL 59 02/05/2019 1202   CHOLHDL 5.6 (H) 02/05/2019 1202   VLDL 20 05/30/2016 0901   LDLCALC 244 (H) 02/05/2019 1202   HEMOGLOBIN A1C Lab Results  Component Value Date   HGBA1C 6.1 (H) 02/05/2019   MPG 128 02/05/2019   TSH Recent Labs    02/05/19 1202  TSH 1.55    PRN Meds:. Medications Discontinued During This Encounter  Medication  Reason  . rosuvastatin (CRESTOR) 20 MG tablet Error   Current Meds  Medication Sig  . aspirin EC 81 MG tablet Take 81 mg by mouth daily. Reported on 06/12/2015  . lisinopril-hydrochlorothiazide (ZESTORETIC) 20-25 MG tablet TAKE 1 TABLET BY MOUTH EVERY DAY  . Multiple Vitamin (MULTIVITAMIN) tablet Take 1 tablet by mouth daily.  [name] ofloxacin (OCUFLOX) 0.3 % ophthalmic solution Instill one drop BID in operative eye(s) starting 2 days prior to surgery and 3 weeks following surgery    Cardiac Studies:   none  Assessment:   Atypical chest pain - Plan: EKG 12-Lead, PCV MYOCARDIAL PERFUSION WITH LEXISCAN  Essential hypertension  Pure hypercholesterolemia  Aortic systolic murmur on examination - Plan: PCV ECHOCARDIOGRAM COMPLETE  Bilateral carotid bruits - Plan: PCV CAROTID DUPLEX (BILATERAL)  EKG 03/14/2019: Normal sinus rhythm at 94 bpm, left atrial abnormality, leftward axis, LVH. Abnormal EKG  Recommendations:   Bonnie Bennett  is a 86 y.o. female  with hypertension, hyperlipidemia, former tobacco use, referred to [place] for evaluation of chest pain.  Despite her advanced age, patient is fairly active. She has recently had episodes of chest pain that are atypical. She has not had any episodes for the last few weeks. She does have abnormal EKG and several risk factors for CAD including hypertension and hyperlipidemia that is likely familial. Will schedule her for lexiscan nuclear stress testing. She is unable to walk on the treadmill due to her  dyspnea.   Blood pressure is markedly elevated today, but is generally stable. Will add amlodipine 5 mg daily both for hypertension and chest pain. I have reviewed her labs from PCP office, she has markedly elevated lipids that are likely suggestive of familial hyperlipidemia. She has not tolerated several statins in the past, but is unsure as to reason she did not tolerate. She is willing to retry Lipitor 10 mg daily. Will start this today, will need repeat lipids in 8 weeks.  She does have aortic stenotic murmur by exam, will obtain echocardiogram to further evaluate. Also has bilateral carotid bruits, will obtain carotid duplex. Encouraged her to make dietary changes to help with controlling her lipids, hypertension, and to also help with weight loss. I will plan to see her after the test for follow up.  Toniann Fail, MSN, APRN, FNP-C [facility] [place] Office: [phone] Fax: [phone]

## 2019-03-15 ENCOUNTER — Encounter: Payer: Self-pay | Admitting: Cardiology

## 2019-03-20 ENCOUNTER — Ambulatory Visit: Payer: Medicare HMO

## 2019-03-20 ENCOUNTER — Other Ambulatory Visit: Payer: Self-pay

## 2019-03-20 DIAGNOSIS — I6523 Occlusion and stenosis of bilateral carotid arteries: Secondary | ICD-10-CM | POA: Diagnosis not present

## 2019-03-20 DIAGNOSIS — R0989 Other specified symptoms and signs involving the circulatory and respiratory systems: Secondary | ICD-10-CM

## 2019-03-20 DIAGNOSIS — I358 Other nonrheumatic aortic valve disorders: Secondary | ICD-10-CM

## 2019-03-20 DIAGNOSIS — R0789 Other chest pain: Secondary | ICD-10-CM | POA: Diagnosis not present

## 2019-03-21 NOTE — Progress Notes (Signed)
Patient aware.

## 2019-03-22 ENCOUNTER — Other Ambulatory Visit: Payer: Self-pay | Admitting: Family Medicine

## 2019-03-23 ENCOUNTER — Other Ambulatory Visit: Payer: Self-pay | Admitting: Cardiology

## 2019-03-23 DIAGNOSIS — I6523 Occlusion and stenosis of bilateral carotid arteries: Secondary | ICD-10-CM

## 2019-04-08 ENCOUNTER — Telehealth: Payer: Self-pay

## 2019-04-08 ENCOUNTER — Other Ambulatory Visit: Payer: Self-pay

## 2019-04-08 ENCOUNTER — Ambulatory Visit: Payer: Medicare HMO | Admitting: Cardiology

## 2019-04-08 ENCOUNTER — Encounter: Payer: Self-pay | Admitting: Cardiology

## 2019-04-08 VITALS — BP 137/64 | HR 74 | Temp 98.4°F | Resp 16 | Ht 63.0 in | Wt 229.4 lb

## 2019-04-08 DIAGNOSIS — R0789 Other chest pain: Secondary | ICD-10-CM | POA: Diagnosis not present

## 2019-04-08 DIAGNOSIS — I6523 Occlusion and stenosis of bilateral carotid arteries: Secondary | ICD-10-CM

## 2019-04-08 DIAGNOSIS — E7800 Pure hypercholesterolemia, unspecified: Secondary | ICD-10-CM

## 2019-04-08 DIAGNOSIS — I10 Essential (primary) hypertension: Secondary | ICD-10-CM

## 2019-04-08 NOTE — Progress Notes (Signed)
Primary Physician:  Donita Brooks, MD   Patient ID: Bonnie Bennett, female    DOB: 08/23/1931, 86 y.o.   MRN: [id number]  Subjective:    Chief Complaint  Patient presents with  . Follow-up    stress, echocardiogram, carotid duplex  . Results    HPI: Bonnie Bennett  is a 86 y.o. female  with hypertension, hyperlipidemia, former tobacco use, recently evaluated by [place] for atypical chest pain. Due to abnormal EKG and risk factors, underwent lexiscan nuclear stress testing and echocardiogram and now presents for follow up.  She has not had any recurrence of chest pain.  Overall feeling well.  Tolerating amlodipine well.  She was started on Lipitor at her last visit and is tolerating this well.  She had 9 siblings, all of which are deceased. States that she had brother had sudden cardiac death in his 40's. Another brother was born with a cardiac condition. She has 3 sons and 4 daughters.  Past Medical History:  Diagnosis Date  . HLD (hyperlipidemia)   . Hypertension       Social History   Socioeconomic History  . Marital status: Widowed    Spouse name: Not on file  . Number of children: 7  . Years of education: Not on file  . Highest education level: Not on file  Occupational History  . Not on file  Tobacco Use  . Smoking status: Former Smoker    Packs/day: 0.25    Years: 45.00    Pack years: 11.25    Types: Cigarettes    Quit date: 1990    Years since quitting: 31.1  . Smokeless tobacco: Never Used  Substance and Sexual Activity  . Alcohol use: Yes    Comment: occasional  . Drug use: No  . Sexual activity: Not on file  Other Topics Concern  . Not on file  Social History Narrative  . Not on file   Social Determinants of Health   Financial Resource Strain:   . Difficulty of Paying Living Expenses: Not on file  Food Insecurity:   . Worried About Programme researcher, broadcasting/film/video in the Last Year: Not on file  . Ran Out of Food in the Last Year: Not on file   Transportation Needs:   . Lack of Transportation (Medical): Not on file  . Lack of Transportation (Non-Medical): Not on file  Physical Activity:   . Days of Exercise per Week: Not on file  . Minutes of Exercise per Session: Not on file  Stress:   . Feeling of Stress : Not on file  Social Connections:   . Frequency of Communication with Friends and Family: Not on file  . Frequency of Social Gatherings with Friends and Family: Not on file  . Attends Religious Services: Not on file  . Active Member of Clubs or Organizations: Not on file  . Attends Banker Meetings: Not on file  . Marital Status: Not on file  Intimate Partner Violence:   . Fear of Current or Ex-Partner: Not on file  . Emotionally Abused: Not on file  . Physically Abused: Not on file  . Sexually Abused: Not on file    Review of Systems  Constitution: Negative for decreased appetite, malaise/fatigue, weight gain and weight loss.  Eyes: Negative for visual disturbance.  Cardiovascular: Negative for chest pain, claudication, dyspnea on exertion, leg swelling, orthopnea, palpitations and syncope.  Respiratory: Negative for hemoptysis and wheezing.   Endocrine: Negative for cold  intolerance and heat intolerance.  Hematologic/Lymphatic: Does not bruise/bleed easily.  Skin: Negative for nail changes.  Musculoskeletal: Negative for muscle weakness and myalgias.  Gastrointestinal: Negative for abdominal pain, change in bowel habit, nausea and vomiting.  Neurological: Negative for difficulty with concentration, dizziness, focal weakness and headaches.  Psychiatric/Behavioral: Negative for altered mental status and suicidal ideas.  All other systems reviewed and are negative.     Objective:  Blood pressure 137/64, pulse 74, temperature 98.4 F (36.9 C), temperature source Temporal, resp. rate 16, height 5\' 3"  (1.6 m), weight 229 lb 6.4 oz (104.1 kg), SpO2 99 %. Body mass index is 40.64 kg/m.    Physical Exam   Constitutional: She is oriented to person, place, and time. Vital signs are normal. She appears well-developed and well-nourished.  HENT:  Head: Normocephalic and atraumatic.  Cardiovascular: Normal rate, regular rhythm and intact distal pulses.  Murmur heard.  Harsh midsystolic murmur is present with a grade of 2/6 at the upper right sternal border radiating to the neck. Pulses:      Carotid pulses are on the right side with bruit and on the left side with bruit. Pulmonary/Chest: Effort normal and breath sounds normal. No accessory muscle usage. No respiratory distress.  Abdominal: Soft. Bowel sounds are normal.  Musculoskeletal:        General: Normal range of motion.     Cervical back: Normal range of motion.  Neurological: She is alert and oriented to person, place, and time.  Skin: Skin is warm and dry.  Vitals reviewed.  Radiology: No results found.  Laboratory examination:    CMP Latest Ref Rng & Units 02/05/2019 05/30/2016 12/17/2015  Glucose 65 - 99 mg/dL 84 104(H) 94  BUN 7 - 25 mg/dL 15 21 16   Creatinine 0.60 - 0.88 mg/dL 1.12(H) 1.13(H) 1.24(H)  Sodium 135 - 146 mmol/L 143 142 140  Potassium 3.5 - 5.3 mmol/L 5.0 5.2 4.7  Chloride 98 - 110 mmol/L 106 107 105  CO2 20 - 32 mmol/L 26 22 25   Calcium 8.6 - 10.4 mg/dL 10.0 9.8 9.5  Total Protein 6.1 - 8.1 g/dL 7.7 7.7 7.4  Total Bilirubin 0.2 - 1.2 mg/dL 0.5 0.4 0.4  Alkaline Phos 33 - 130 U/L - 50 47  AST 10 - 35 U/L 14 17 17   ALT 6 - 29 U/L 6 7 7    CBC Latest Ref Rng & Units 02/05/2019 05/30/2016 12/17/2015  WBC 3.8 - 10.8 Thousand/uL 8.9 8.6 7.7  Hemoglobin 11.7 - 15.5 g/dL 13.6 13.5 13.4  Hematocrit 35.0 - 45.0 % 42.1 42.3 41.7  Platelets 140 - 400 Thousand/uL 257 284 287   Lipid Panel     Component Value Date/Time   CHOL 330 (H) 02/05/2019 1202   TRIG 125 02/05/2019 1202   HDL 59 02/05/2019 1202   CHOLHDL 5.6 (H) 02/05/2019 1202   VLDL 20 05/30/2016 0901   LDLCALC 244 (H) 02/05/2019 1202   HEMOGLOBIN  A1C Lab Results  Component Value Date   HGBA1C 6.1 (H) 02/05/2019   MPG 128 02/05/2019   TSH Recent Labs    02/05/19 1202  TSH 1.55    PRN Meds:. There are no discontinued medications. Current Meds  Medication Sig  . amLODipine (NORVASC) 5 MG tablet Take 1 tablet (5 mg total) by mouth daily.  [name] aspirin EC 81 MG tablet Take 81 mg by mouth daily. Reported on 06/12/2015  . atorvastatin (LIPITOR) 10 MG tablet Take 1 tablet (10 mg total) by mouth daily.  [name]  lisinopril-hydrochlorothiazide (ZESTORETIC) 20-25 MG tablet TAKE 1 TABLET BY MOUTH EVERY DAY  . Multiple Vitamin (MULTIVITAMIN) tablet Take 1 tablet by mouth daily.  [name] ofloxacin (OCUFLOX) 0.3 % ophthalmic solution Instill one drop BID in operative eye(s) starting 2 days prior to surgery and 3 weeks following surgery    Cardiac Studies:   Lexiscan Tetrofosmin Stress Test  03/20/2019: Nondiagnostic ECG stress. Myocardial perfusion is normal. Stress LV EF: 67%.  No previous exam available for comparison. Low risk study  Carotid artery duplex 03/20/2019:  Doppler velocity suggests stenosis in the right internal carotid artery  (16-49%). Doppler velocity suggests stenosis in the right external carotid  artery (>50%).  Doppler velocity suggests stenosis in the left internal carotid artery  (50-69%). Doppler velocity suggests stenosis in the left external carotid  artery (>50%).  Antegrade right vertebral artery flow. Antegrade left vertebral artery  flow.  Follow up in six months is appropriate if clinically indicated.  Echocardiogram 03/20/2019:  Left ventricle cavity is normal in size. Mild concentric hypertrophy of  the left ventricle. Normal LV systolic function with EF 65-70%. Normal  global wall motion. Doppler evidence of grade I (impaired) diastolic  dysfunction, normal LAP.  Mild (Grade I) mitral regurgitation.  Mild tricuspid regurgitation. Estimated pulmonary artery systolic pressure  is 27 mmHg.  Mild pulmonic  regurgitation.  Assessment:   Atypical chest pain  Asymptomatic bilateral carotid artery stenosis  Essential hypertension  Pure hypercholesterolemia  EKG 03/14/2019: Normal sinus rhythm at 94 bpm, left atrial abnormality, leftward axis, LVH. Abnormal EKG  Recommendations:   SHAM ALVIAR  is a 86 y.o. female  with hypertension, hyperlipidemia, former tobacco use, recently evaluated by [place] for atypical chest pain and abnormal EKG.  Underwent Lexiscan nuclear stress testing and echocardiogram and now presents to discuss results.  She has not had any recurrence of chest pain since last seen by me.  I have reviewed and discussed her stress test report, no evidence of ischemia.  Considered low risk study.  Echocardiogram shows mild LVH and grade 1 diastolic dysfunction, likely related to uncontrolled hypertension.  Her blood pressure is much better controlled with amlodipine, will continue the same.  She will continue to need risk factor modification.  Would recommend repeating her lipids in the next 4 weeks to follow-up on hyperlipidemia since starting Lipitor.  She may need higher dose, will assess at that time.  High bilateral carotid stenosis that is asymptomatic.  Will need repeat carotid duplex in 6 months for continued surveillance.  Continue with aspirin and statin therapy.  We will have her follow-up in 2 months for follow-up on hyperlipidemia and hypertension.  Toniann Fail, MSN, APRN, FNP-C [facility] [place] Office: [phone] Fax: [phone]

## 2019-04-28 ENCOUNTER — Other Ambulatory Visit: Payer: Self-pay | Admitting: Family Medicine

## 2019-05-14 NOTE — Telephone Encounter (Signed)
Error

## 2019-05-17 ENCOUNTER — Other Ambulatory Visit: Payer: Self-pay | Admitting: Cardiology

## 2019-05-17 DIAGNOSIS — E7800 Pure hypercholesterolemia, unspecified: Secondary | ICD-10-CM | POA: Diagnosis not present

## 2019-05-18 LAB — LIPID PANEL
Chol/HDL Ratio: 3.7 ratio (ref 0.0–4.4)
Cholesterol, Total: 206 mg/dL — ABNORMAL HIGH (ref 100–199)
HDL: 56 mg/dL (ref >39)
LDL Chol Calc (NIH): 130 mg/dL — ABNORMAL HIGH (ref 0–99)
Triglycerides: 110 mg/dL (ref 0–149)
VLDL Cholesterol Cal: 20 mg/dL (ref 5–40)

## 2019-06-06 ENCOUNTER — Other Ambulatory Visit: Payer: Self-pay | Admitting: Cardiology

## 2019-06-10 ENCOUNTER — Ambulatory Visit: Payer: Medicare HMO | Admitting: Cardiology

## 2019-06-14 ENCOUNTER — Other Ambulatory Visit: Payer: Self-pay | Admitting: Cardiology

## 2019-08-29 ENCOUNTER — Other Ambulatory Visit: Payer: Self-pay | Admitting: Cardiology

## 2019-09-21 DIAGNOSIS — Z008 Encounter for other general examination: Secondary | ICD-10-CM | POA: Diagnosis not present

## 2019-09-21 DIAGNOSIS — I10 Essential (primary) hypertension: Secondary | ICD-10-CM | POA: Diagnosis not present

## 2019-09-21 DIAGNOSIS — Z6841 Body Mass Index (BMI) 40.0 and over, adult: Secondary | ICD-10-CM | POA: Diagnosis not present

## 2019-09-21 DIAGNOSIS — E785 Hyperlipidemia, unspecified: Secondary | ICD-10-CM | POA: Diagnosis not present

## 2019-09-21 DIAGNOSIS — Z823 Family history of stroke: Secondary | ICD-10-CM | POA: Diagnosis not present

## 2019-09-21 DIAGNOSIS — R32 Unspecified urinary incontinence: Secondary | ICD-10-CM | POA: Diagnosis not present

## 2019-09-21 DIAGNOSIS — Z7982 Long term (current) use of aspirin: Secondary | ICD-10-CM | POA: Diagnosis not present

## 2019-09-21 DIAGNOSIS — I739 Peripheral vascular disease, unspecified: Secondary | ICD-10-CM | POA: Diagnosis not present

## 2019-10-01 ENCOUNTER — Ambulatory Visit (INDEPENDENT_AMBULATORY_CARE_PROVIDER_SITE_OTHER): Payer: Medicare HMO | Admitting: Family Medicine

## 2019-10-01 ENCOUNTER — Other Ambulatory Visit: Payer: Self-pay

## 2019-10-01 VITALS — BP 140/70 | HR 83 | Temp 96.7°F | Ht 63.0 in | Wt 230.0 lb

## 2019-10-01 DIAGNOSIS — I6522 Occlusion and stenosis of left carotid artery: Secondary | ICD-10-CM

## 2019-10-01 DIAGNOSIS — E7800 Pure hypercholesterolemia, unspecified: Secondary | ICD-10-CM

## 2019-10-01 DIAGNOSIS — I10 Essential (primary) hypertension: Secondary | ICD-10-CM

## 2019-10-01 MED ORDER — LORAZEPAM 0.5 MG PO TABS: 0.5000 mg | ORAL_TABLET | Freq: Three times a day (TID) | ORAL | 0 refills | Status: DC | PRN

## 2019-10-01 NOTE — Progress Notes (Signed)
Subjective:    Patient ID: Bonnie Bennett, female    DOB: 1931-10-22, 86 y.o.   MRN: [id number]  HPI  12/20 Patient initially made the appointment today because she was seen at home by her insurance company and was told that she had poor circulation in her legs.  Patient has not been seen in more than 2 years by this provider.  She has a history of hypertension.  She refuses any statin for hyperlipidemia.  Her blood pressure today is well controlled at 138/80 however she reports that over the last 3 weeks she has developed left-sided chest pain.  The pain is nonexertional.  She denies any shortness of breath with activity.  However recently, the pain was so severe, 6 on a scale of 1-10 in, which she considered going to the hospital.  However the pain improved and she did not.  She denies any shortness of breath with activity.  She denies any chest pain with activity such as walking or going up and down stairs.  She denies any chest pain lifting objects or physical exertion.  The pain tends to occur more at night.  It is located in her left breast.  She denies any cough.  She denies any pleurisy.  She denies any hemoptysis.  She denies any shortness of breath, orthopnea, or paroxysmal nocturnal dyspnea.  EKG today in the office shows normal sinus rhythm with normal intervals and a normal axis and no evidence of ischemia or infarction.  She denies any claudication in her legs however she does report numbness and tingling in her toes which sounds more consistent with peripheral neuropathy.  At that time, my plan was: Patient's chest pain is very atypical and EKG is reassuring however she has numerous risk factors for coronary artery disease including age, obesity, hypertension, and on controlled hyperlipidemia.  Therefore I recommended a cardiology consultation for a stress test.  I would also obtain a chest x-ray as part of the work-up for her chest pain.  Check fasting lab work including a CBC, CMP,  fasting lipid panel to gauge her cardiac risk factors.  Given the peripheral neuropathy I will also check a TSH, vitamin B12, and hemoglobin A1c.  If the patient develops chest pain again, she has been pain-free now for the last 4 days, I have directed her to go immediately to the hospital.  She agrees.  Otherwise arrange outpatient cardiology consultation.  10/01/19 Patient saw cardiology.  Stress test was negative for ischemia with a normal ejection fraction.  However she had carotid Dopplers that showed asymptomatic left carotid artery stenosis of 40 to 69%..  The patient is now taking a statin along with an aspirin on a daily basis.  She is here today to follow-up.  Blood pressure today is well controlled at 140/70.  She is tolerating Lipitor 10 mg a day and aspirin 81 mg a day.  She is consistently taking her amlodipine and Zestoretic.  She denies any further chest pain shortness of breath or dyspnea on exertion.  Unfortunately, the patient lost his son in February due to carbon monoxide poisoning.  She has been dealing with this and having a difficult time sleeping at night on occasion. Past Medical History:  Diagnosis Date  . HLD (hyperlipidemia)   . Hypertension    Current Outpatient Medications on File Prior to Visit  Medication Sig Dispense Refill  . amLODipine (NORVASC) 5 MG tablet TAKE 1 TABLET BY MOUTH EVERY DAY 90 tablet 0  .  aspirin EC 81 MG tablet Take 81 mg by mouth daily. Reported on 06/12/2015    . atorvastatin (LIPITOR) 10 MG tablet TAKE 1 TABLET BY MOUTH EVERY DAY 90 tablet 0  . lisinopril-hydrochlorothiazide (ZESTORETIC) 20-25 MG tablet TAKE 1 TABLET BY MOUTH EVERY DAY 90 tablet 2  . Multiple Vitamin (MULTIVITAMIN) tablet Take 1 tablet by mouth daily.    [name] ofloxacin (OCUFLOX) 0.3 % ophthalmic solution Instill one drop BID in operative eye(s) starting 2 days prior to surgery and 3 weeks following surgery     No current facility-administered medications on file prior to visit.    No Known Allergies Social History   Socioeconomic History  . Marital status: Widowed    Spouse name: Not on file  . Number of children: 7  . Years of education: Not on file  . Highest education level: Not on file  Occupational History  . Not on file  Tobacco Use  . Smoking status: Former Smoker    Packs/day: 0.25    Years: 45.00    Pack years: 11.25    Types: Cigarettes    Quit date: 1990    Years since quitting: 31.6  . Smokeless tobacco: Never Used  Vaping Use  . Vaping Use: Never used  Substance and Sexual Activity  . Alcohol use: Yes    Comment: occasional  . Drug use: No  . Sexual activity: Not on file  Other Topics Concern  . Not on file  Social History Narrative  . Not on file   Social Determinants of Health   Financial Resource Strain:   . Difficulty of Paying Living Expenses: Not on file  Food Insecurity:   . Worried About Programme researcher, broadcasting/film/video in the Last Year: Not on file  . Ran Out of Food in the Last Year: Not on file  Transportation Needs:   . Lack of Transportation (Medical): Not on file  . Lack of Transportation (Non-Medical): Not on file  Physical Activity:   . Days of Exercise per Week: Not on file  . Minutes of Exercise per Session: Not on file  Stress:   . Feeling of Stress : Not on file  Social Connections:   . Frequency of Communication with Friends and Family: Not on file  . Frequency of Social Gatherings with Friends and Family: Not on file  . Attends Religious Services: Not on file  . Active Member of Clubs or Organizations: Not on file  . Attends Banker Meetings: Not on file  . Marital Status: Not on file  Intimate Partner Violence:   . Fear of Current or Ex-Partner: Not on file  . Emotionally Abused: Not on file  . Physically Abused: Not on file  . Sexually Abused: Not on file      Review of Systems     Objective:   Physical Exam Constitutional:      General: She is not in acute distress.    Appearance:  Normal appearance. She is normal weight. She is not ill-appearing or toxic-appearing.  Cardiovascular:     Rate and Rhythm: Normal rate.     Heart sounds: Normal heart sounds. No murmur heard.  No friction rub. No gallop.   Pulmonary:     Effort: Pulmonary effort is normal. No respiratory distress.     Breath sounds: Normal breath sounds. No stridor. No wheezing, rhonchi or rales.  Chest:     Chest wall: No tenderness.  Abdominal:     General: Abdomen is  flat. Bowel sounds are normal. There is no distension.     Palpations: Abdomen is soft.     Tenderness: There is no abdominal tenderness. There is no guarding.  Musculoskeletal:     Right lower leg: No edema.     Left lower leg: No edema.  Neurological:     Mental Status: She is alert.           Assessment & Plan:  Pure hypercholesterolemia - Plan: CBC with Differential/Platelet, COMPLETE METABOLIC PANEL WITH GFR, Lipid panel  Essential hypertension  Left carotid artery stenosis  Blood pressure today is excellent.  Check CBC, CMP, fasting lipid panel.  Goal LDL cholesterol is less than 70.  If patient is tolerating the Lipitor and her LDL cholesterol is above 70 I would increase to 40 mg a day.  Recommended she take an aspirin 81 mg daily to help prevent stroke.  Recommend repeating carotid Dopplers annually.  We will give the patient Ativan 0.5 mg p.o. nightly as needed insomnia

## 2019-10-02 LAB — COMPLETE METABOLIC PANEL WITH GFR
AG Ratio: 1.3 (calc) (ref 1.0–2.5)
ALT: 7 U/L (ref 6–29)
AST: 15 U/L (ref 10–35)
Albumin: 4.1 g/dL (ref 3.6–5.1)
Alkaline phosphatase (APISO): 55 U/L (ref 37–153)
BUN/Creatinine Ratio: 13 (calc) (ref 6–22)
BUN: 14 mg/dL (ref 7–25)
CO2: 23 mmol/L (ref 20–32)
Calcium: 9.5 mg/dL (ref 8.6–10.4)
Chloride: 107 mmol/L (ref 98–110)
Creat: 1.08 mg/dL — ABNORMAL HIGH (ref 0.60–0.88)
GFR, Est African American: 54 mL/min/{1.73_m2} — ABNORMAL LOW (ref >=60)
GFR, Est Non African American: 46 mL/min/{1.73_m2} — ABNORMAL LOW (ref >=60)
Globulin: 3.1 g/dL (calc) (ref 1.9–3.7)
Glucose, Bld: 93 mg/dL (ref 65–99)
Potassium: 4.1 mmol/L (ref 3.5–5.3)
Sodium: 142 mmol/L (ref 135–146)
Total Bilirubin: 0.5 mg/dL (ref 0.2–1.2)
Total Protein: 7.2 g/dL (ref 6.1–8.1)

## 2019-10-02 LAB — CBC WITH DIFFERENTIAL/PLATELET
Absolute Monocytes: 548 cells/uL (ref 200–950)
Basophils Absolute: 52 cells/uL (ref 0–200)
Basophils Relative: 0.7 %
Eosinophils Absolute: 141 cells/uL (ref 15–500)
Eosinophils Relative: 1.9 %
HCT: 41.5 % (ref 35.0–45.0)
Hemoglobin: 13.5 g/dL (ref 11.7–15.5)
Lymphs Abs: 2716 cells/uL (ref 850–3900)
MCH: 29.5 pg (ref 27.0–33.0)
MCHC: 32.5 g/dL (ref 32.0–36.0)
MCV: 90.8 fL (ref 80.0–100.0)
MPV: 12.2 fL (ref 7.5–12.5)
Monocytes Relative: 7.4 %
Neutro Abs: 3944 cells/uL (ref 1500–7800)
Neutrophils Relative %: 53.3 %
Platelets: 287 10*3/uL (ref 140–400)
RBC: 4.57 10*6/uL (ref 3.80–5.10)
RDW: 12.7 % (ref 11.0–15.0)
Total Lymphocyte: 36.7 %
WBC: 7.4 10*3/uL (ref 3.8–10.8)

## 2019-10-02 LAB — LIPID PANEL
Cholesterol: 229 mg/dL — ABNORMAL HIGH (ref <200)
HDL: 58 mg/dL (ref >=50)
LDL Cholesterol (Calc): 151 mg/dL (calc) — ABNORMAL HIGH
Non-HDL Cholesterol (Calc): 171 mg/dL (calc) — ABNORMAL HIGH (ref <130)
Total CHOL/HDL Ratio: 3.9 (calc) (ref <5.0)
Triglycerides: 92 mg/dL (ref <150)

## 2019-10-17 ENCOUNTER — Ambulatory Visit: Payer: Medicare HMO | Admitting: Family Medicine

## 2019-10-23 ENCOUNTER — Other Ambulatory Visit: Payer: Self-pay

## 2019-11-04 DIAGNOSIS — Z20828 Contact with and (suspected) exposure to other viral communicable diseases: Secondary | ICD-10-CM | POA: Diagnosis not present

## 2019-11-21 ENCOUNTER — Other Ambulatory Visit: Payer: Self-pay | Admitting: Cardiology

## 2020-01-20 ENCOUNTER — Other Ambulatory Visit: Payer: Medicare HMO

## 2020-01-20 ENCOUNTER — Other Ambulatory Visit: Payer: Self-pay

## 2020-01-23 ENCOUNTER — Other Ambulatory Visit: Payer: Self-pay

## 2020-01-23 ENCOUNTER — Ambulatory Visit (INDEPENDENT_AMBULATORY_CARE_PROVIDER_SITE_OTHER): Payer: Medicare HMO | Admitting: Family Medicine

## 2020-01-23 VITALS — BP 130/80 | HR 68 | Temp 98.3°F | Ht 63.0 in | Wt 227.0 lb

## 2020-01-23 DIAGNOSIS — E7800 Pure hypercholesterolemia, unspecified: Secondary | ICD-10-CM | POA: Diagnosis not present

## 2020-01-23 DIAGNOSIS — I6522 Occlusion and stenosis of left carotid artery: Secondary | ICD-10-CM | POA: Diagnosis not present

## 2020-01-23 DIAGNOSIS — Z23 Encounter for immunization: Secondary | ICD-10-CM | POA: Diagnosis not present

## 2020-01-23 DIAGNOSIS — I10 Essential (primary) hypertension: Secondary | ICD-10-CM

## 2020-01-23 NOTE — Progress Notes (Signed)
Subjective:    Patient ID: Bonnie Bennett, female    DOB: [birth year], 86 y.o.   MRN: [id number]  HPI  Patient has a history of hypertension and hyperlipidemia.  Her LDL cholesterol has remained significantly elevated.  She has a history of a son who died due to coronary artery disease.  However she has been hesitant to increase her dose of the statin beyond 10 mg a day.  She denies any myalgias or right upper quadrant pain.  I have strongly encouraged her to increase her dose of Lipitor further but she continues to decline.  She is due for a flu shot and she would be willing to receive that today.  She denies any chest pain shortness of breath or dyspnea on exertion.  She denies any TIA-like symptoms.  She is consistently taking her aspirin. Past Medical History:  Diagnosis Date  . HLD (hyperlipidemia)   . Hypertension    Current Outpatient Medications on File Prior to Visit  Medication Sig Dispense Refill  . amLODipine (NORVASC) 5 MG tablet TAKE 1 TABLET BY MOUTH EVERY DAY 90 tablet 0  . aspirin EC 81 MG tablet Take 81 mg by mouth daily. Reported on 06/12/2015    . atorvastatin (LIPITOR) 10 MG tablet TAKE 1 TABLET BY MOUTH EVERY DAY 90 tablet 0  . lisinopril-hydrochlorothiazide (ZESTORETIC) 20-25 MG tablet TAKE 1 TABLET BY MOUTH EVERY DAY 90 tablet 2  . LORazepam (ATIVAN) 0.5 MG tablet Take 1 tablet (0.5 mg total) by mouth every 8 (eight) hours as needed for anxiety or sleep. 30 tablet 0  . Multiple Vitamin (MULTIVITAMIN) tablet Take 1 tablet by mouth daily.    [name] ofloxacin (OCUFLOX) 0.3 % ophthalmic solution Instill one drop BID in operative eye(s) starting 2 days prior to surgery and 3 weeks following surgery     No current facility-administered medications on file prior to visit.   No Known Allergies Social History   Socioeconomic History  . Marital status: Widowed    Spouse name: Not on file  . Number of children: 7  . Years of education: Not on file  . Highest education  level: Not on file  Occupational History  . Not on file  Tobacco Use  . Smoking status: Former Smoker    Packs/day: 0.25    Years: 45.00    Pack years: 11.25    Types: Cigarettes    Quit date: 1990    Years since quitting: 31.9  . Smokeless tobacco: Never Used  Vaping Use  . Vaping Use: Never used  Substance and Sexual Activity  . Alcohol use: Yes    Comment: occasional  . Drug use: No  . Sexual activity: Not on file  Other Topics Concern  . Not on file  Social History Narrative  . Not on file   Social Determinants of Health   Financial Resource Strain: Not on file  Food Insecurity: Not on file  Transportation Needs: Not on file  Physical Activity: Not on file  Stress: Not on file  Social Connections: Not on file  Intimate Partner Violence: Not on file      Review of Systems     Objective:   Physical Exam Constitutional:      General: She is not in acute distress.    Appearance: Normal appearance. She is normal weight. She is not ill-appearing or toxic-appearing.  Cardiovascular:     Rate and Rhythm: Normal rate.     Heart sounds: Normal heart sounds. No  murmur heard. No friction rub. No gallop.   Pulmonary:     Effort: Pulmonary effort is normal. No respiratory distress.     Breath sounds: Normal breath sounds. No stridor. No wheezing, rhonchi or rales.  Chest:     Chest wall: No tenderness.  Abdominal:     General: Abdomen is flat. Bowel sounds are normal. There is no distension.     Palpations: Abdomen is soft.     Tenderness: There is no abdominal tenderness. There is no guarding.  Musculoskeletal:     Right lower leg: No edema.     Left lower leg: No edema.  Neurological:     Mental Status: She is alert.           Assessment & Plan:  Left carotid artery stenosis - Plan: CBC with Differential/Platelet, COMPLETE METABOLIC PANEL WITH GFR, Lipid panel  Essential hypertension - Plan: CBC with Differential/Platelet, COMPLETE METABOLIC PANEL WITH  GFR, Lipid panel  Pure hypercholesterolemia - Plan: CBC with Differential/Platelet, COMPLETE METABOLIC PANEL WITH GFR, Lipid panel  Blood pressures acceptable.  Patient received her flu shot today.  I spent more than 20 minutes today with her try to explain the importance of getting her LDL cholesterol lowered to help reduce her long-term stroke risk.  However the patient is resistant to taking any higher dose of the statin.  Therefore I will continue her on Lipitor 10 mg a day although I will check a CMP and a lipid panel today to determine how well the Lipitor is working and what her risk is.

## 2020-01-26 ENCOUNTER — Other Ambulatory Visit: Payer: Self-pay | Admitting: Family Medicine

## 2020-02-12 ENCOUNTER — Other Ambulatory Visit: Payer: Self-pay | Admitting: Cardiology

## 2020-06-08 ENCOUNTER — Telehealth: Payer: Self-pay | Admitting: Family Medicine

## 2020-06-08 NOTE — Telephone Encounter (Signed)
Left message for patient to call back and schedule Medicare Annual Wellness Visit (AWV) in office.   If not able to come in office, please offer to do virtually or by telephone.   Last AWV: 05/30/2016   Please schedule at anytime with [facility]Nurse Health Advisor.  If any questions, please contact me at [phone]

## 2020-08-13 ENCOUNTER — Other Ambulatory Visit: Payer: Self-pay

## 2020-08-13 ENCOUNTER — Ambulatory Visit (INDEPENDENT_AMBULATORY_CARE_PROVIDER_SITE_OTHER): Payer: Medicare HMO | Admitting: Family Medicine

## 2020-08-13 ENCOUNTER — Encounter: Payer: Self-pay | Admitting: Family Medicine

## 2020-08-13 VITALS — BP 148/86 | HR 100 | Temp 97.7°F | Resp 16 | Ht 63.0 in | Wt 230.0 lb

## 2020-08-13 DIAGNOSIS — R252 Cramp and spasm: Secondary | ICD-10-CM | POA: Diagnosis not present

## 2020-08-13 NOTE — Progress Notes (Signed)
Subjective:    Patient ID: Bonnie Bennett, female    DOB: April 19, 1931, 87 y.o.   MRN: [id number]  HPI  Patient presents today complaining of abnormal sensations in her left lower quadrant.  She states that over the weekend, she felt like "there was a baby moving" in her belly.  It was in the left lower quadrant.  She felt like she could see it move below the skin.  She denies any pain.  She denies any melena or hematochezia.  She denies any diarrhea or constipation.  She denies any fevers or chills.  It has now quit.  However she does report increased cramps in her lower extremities particularly in her calves and thighs at night.  She is on hydrochlorothiazide. Past Medical History:  Diagnosis Date   HLD (hyperlipidemia)    Hypertension    Current Outpatient Medications on File Prior to Visit  Medication Sig Dispense Refill   amLODipine (NORVASC) 5 MG tablet TAKE 1 TABLET BY MOUTH EVERY DAY 90 tablet 0   aspirin EC 81 MG tablet Take 81 mg by mouth daily. Reported on 06/12/2015     atorvastatin (LIPITOR) 10 MG tablet TAKE 1 TABLET BY MOUTH EVERY DAY 90 tablet 0   lisinopril-hydrochlorothiazide (ZESTORETIC) 20-25 MG tablet TAKE 1 TABLET BY MOUTH EVERY DAY 90 tablet 2   LORazepam (ATIVAN) 0.5 MG tablet Take 1 tablet (0.5 mg total) by mouth every 8 (eight) hours as needed for anxiety or sleep. 30 tablet 0   Multiple Vitamin (MULTIVITAMIN) tablet Take 1 tablet by mouth daily.     ofloxacin (OCUFLOX) 0.3 % ophthalmic solution Instill one drop BID in operative eye(s) starting 2 days prior to surgery and 3 weeks following surgery     No current facility-administered medications on file prior to visit.   No Known Allergies Social History   Socioeconomic History   Marital status: Widowed    Spouse name: Not on file   Number of children: 7   Years of education: Not on file   Highest education level: Not on file  Occupational History   Not on file  Tobacco Use   Smoking status: Former     Packs/day: 0.25    Years: 45.00    Pack years: 11.25    Types: Cigarettes    Quit date: 5    Years since quitting: 32.5   Smokeless tobacco: Never  Vaping Use   Vaping Use: Never used  Substance and Sexual Activity   Alcohol use: Yes    Comment: occasional   Drug use: No   Sexual activity: Not on file  Other Topics Concern   Not on file  Social History Narrative   Not on file   Social Determinants of Health   Financial Resource Strain: Not on file  Food Insecurity: Not on file  Transportation Needs: Not on file  Physical Activity: Not on file  Stress: Not on file  Social Connections: Not on file  Intimate Partner Violence: Not on file      Review of Systems     Objective:   Physical Exam Vitals reviewed.  Constitutional:      General: She is not in acute distress.    Appearance: Normal appearance. She is obese. She is not ill-appearing.  Cardiovascular:     Rate and Rhythm: Normal rate and regular rhythm.     Heart sounds: Normal heart sounds. No murmur heard. Pulmonary:     Effort: Pulmonary effort is normal. No respiratory  distress.     Breath sounds: Normal breath sounds. No stridor. No wheezing or rales.  Abdominal:     General: Abdomen is flat. Bowel sounds are normal. There is no distension.     Palpations: Abdomen is soft. There is no mass.     Tenderness: There is no abdominal tenderness. There is no guarding or rebound.     Hernia: No hernia is present.  Neurological:     Mental Status: She is alert.         Assessment & Plan:  Cramps, muscle, general I believe the sensation in her lower abdomen was either peristalsis that she was experiencing or potentially muscle fasciculations in the abdominal wall muscles.  Today there is no hernia appreciated in the affected area or palpable abnormality and the symptoms have subsided.  I have recommended that she try drinking tonic water with quinine due to the cramps she is experiencing at night and also  try to increase the bananas and potassium rich foods in her diet.  I recommended stretching of the leg muscles at night prior to bed.  I suggested getting a CMP to evaluate her electrolytes today but she declined

## 2020-10-26 ENCOUNTER — Other Ambulatory Visit: Payer: Self-pay | Admitting: Family Medicine

## 2020-11-09 IMAGING — DX DG CHEST 2V
2 series · 2 of 2 positions shown · non-contrast
Comparison: 09/11/2009

CLINICAL DATA: Chest pain

EXAM:
CHEST - 2 VIEW

[dg chest 2 view (1 of 2)]
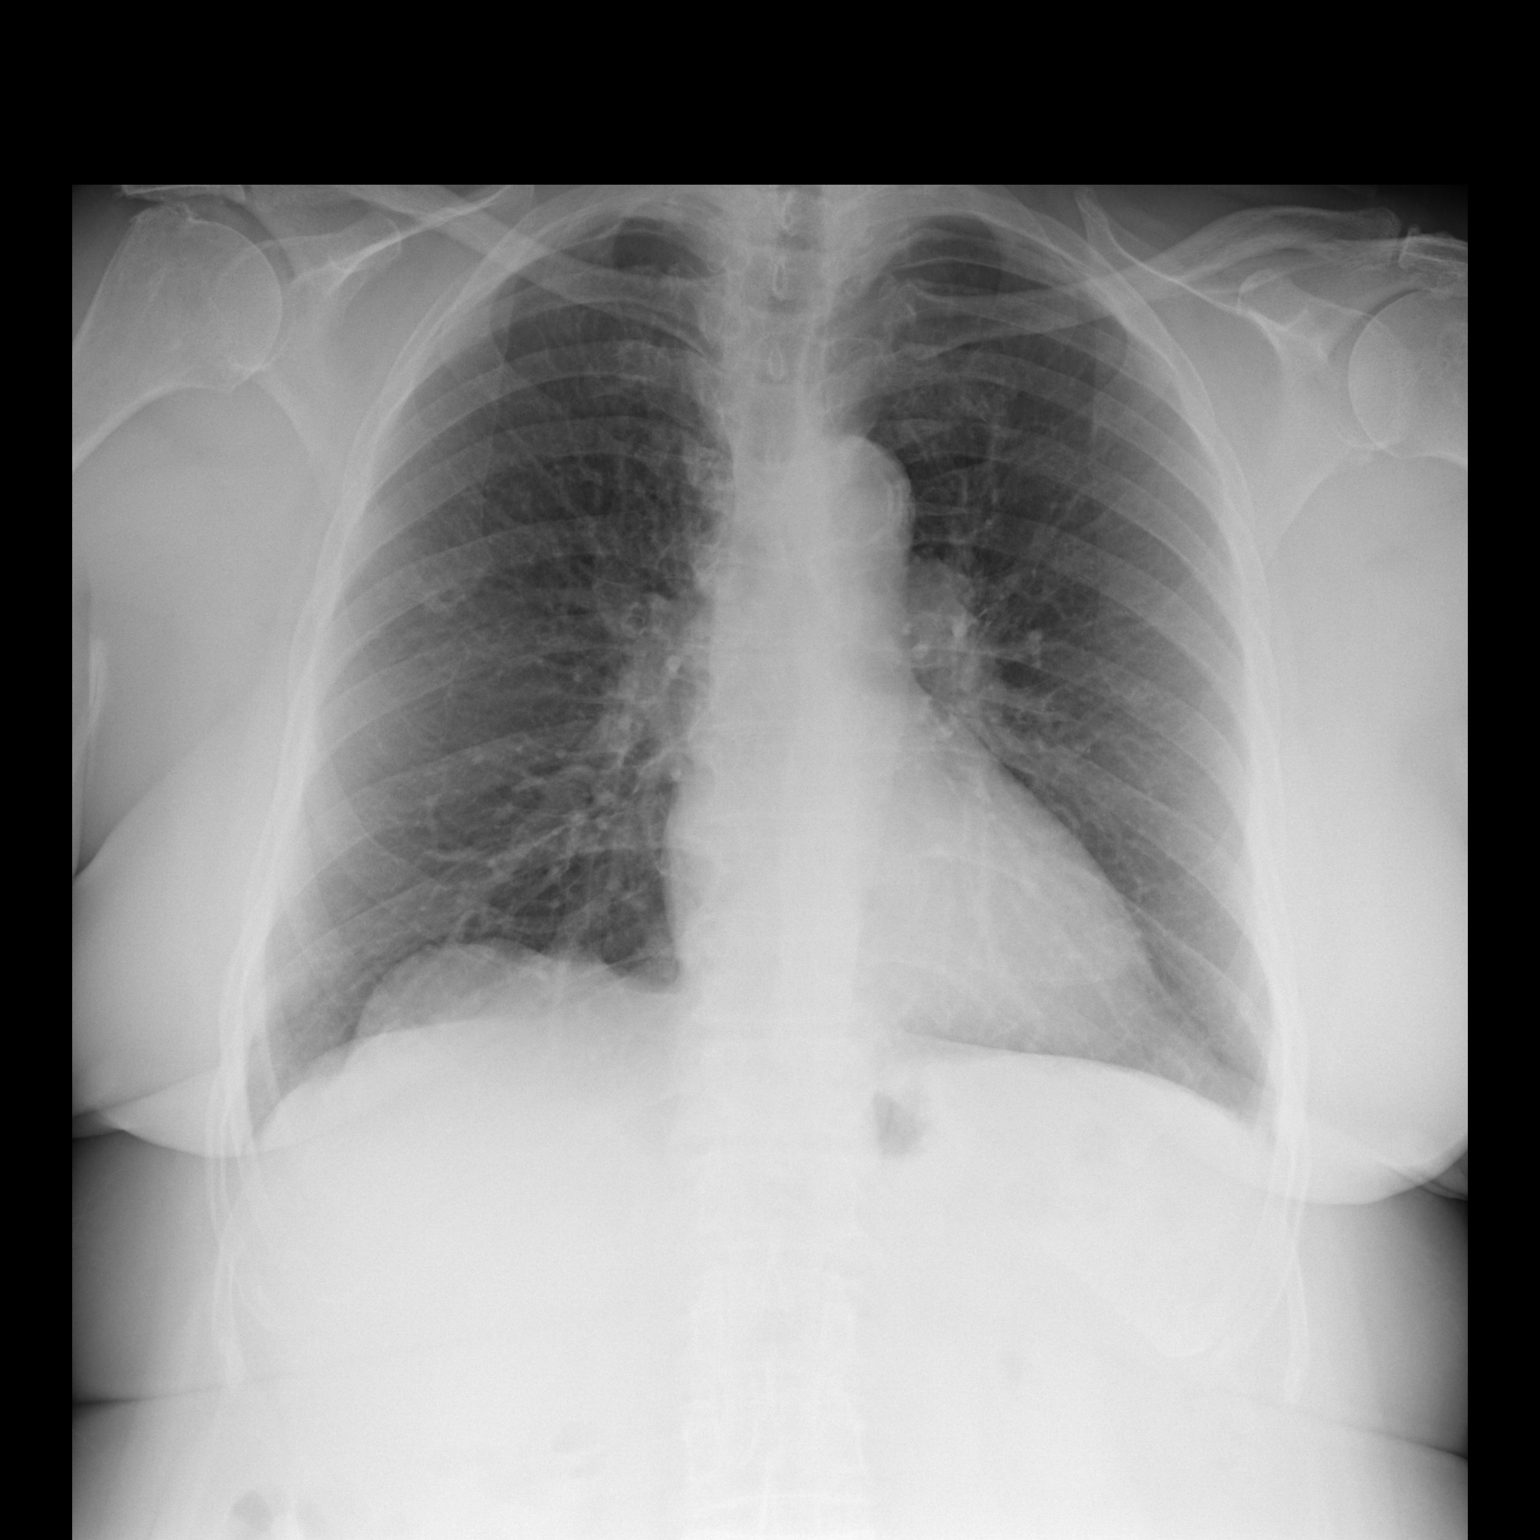

[dg chest 2 view (2 of 2)]
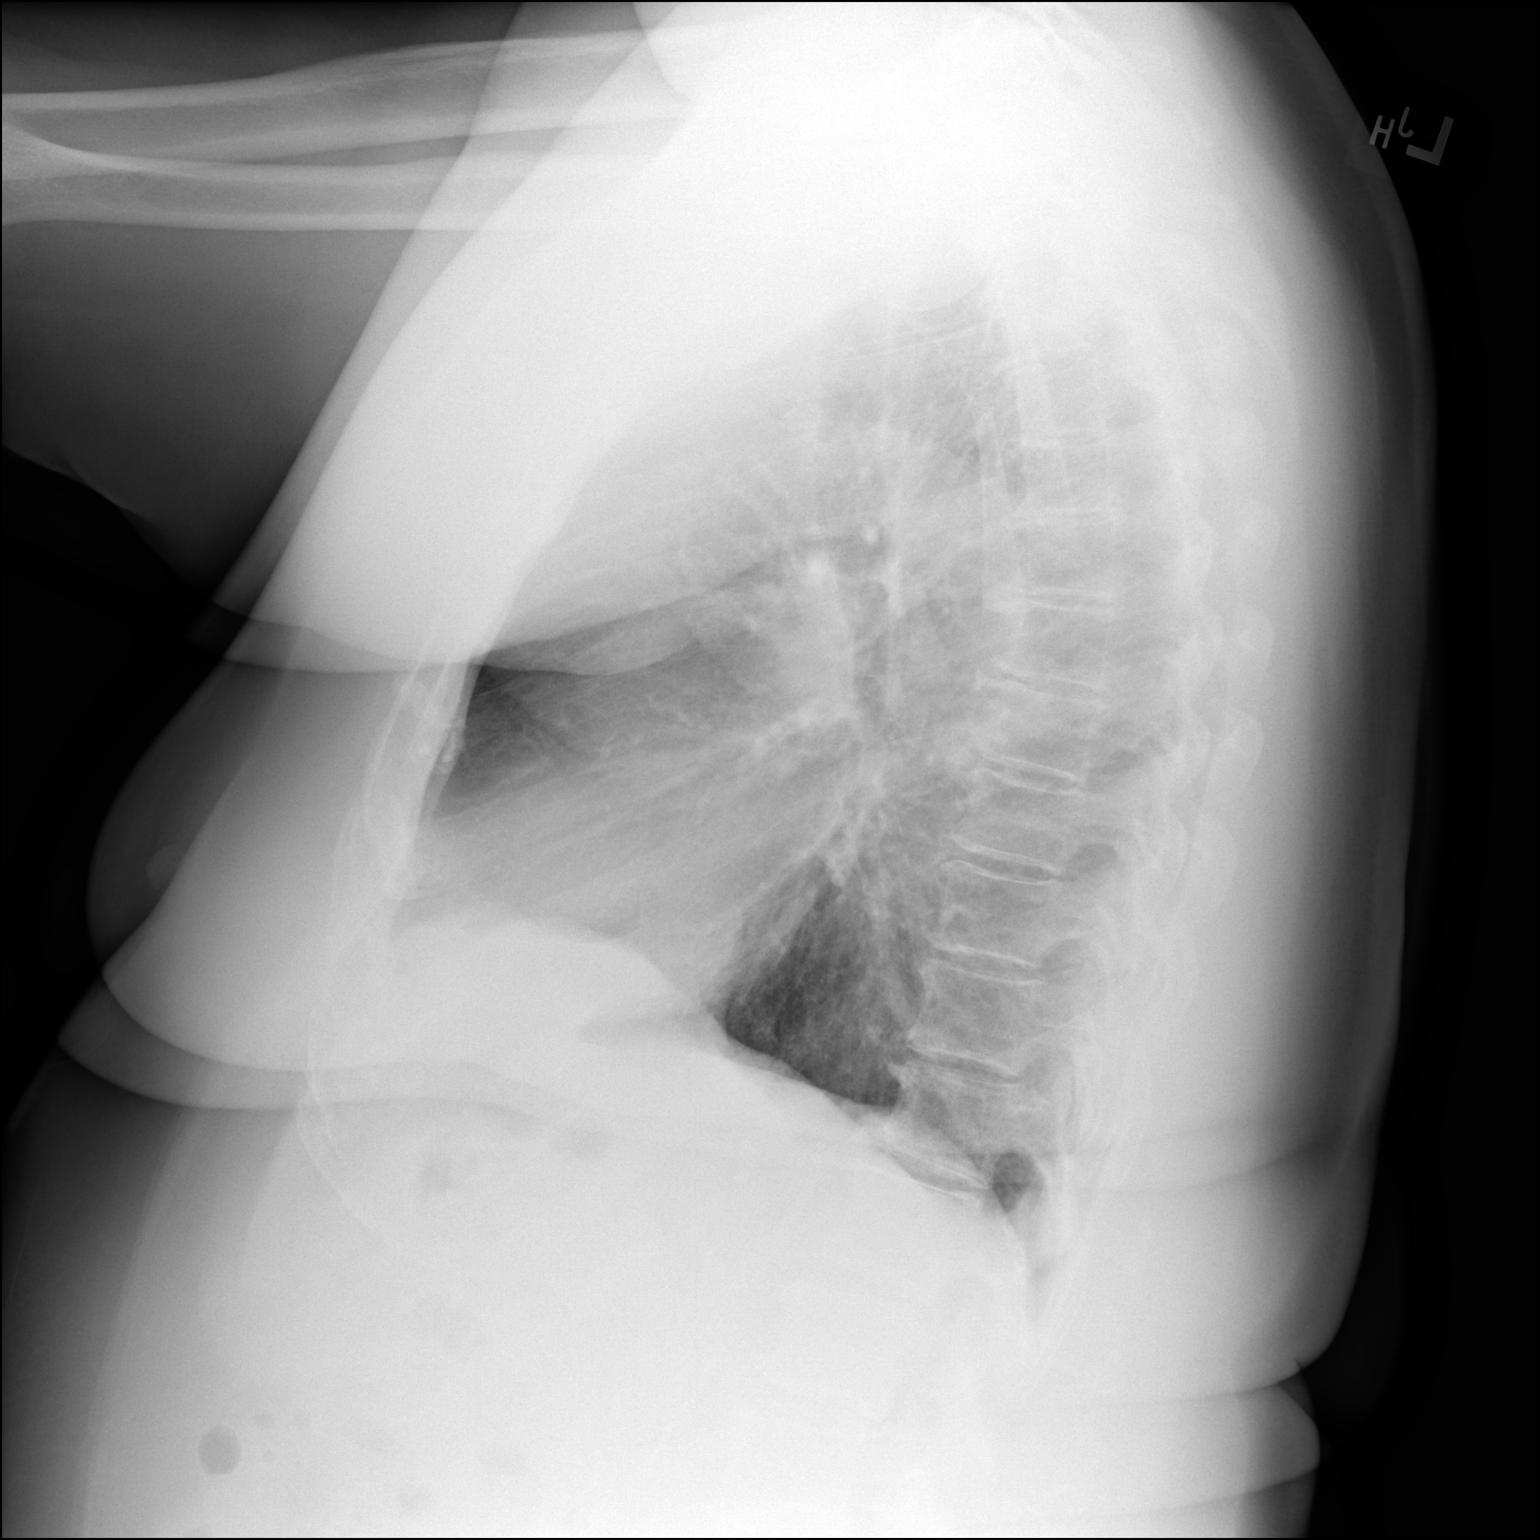

[2 of 2 positions shown; findings below may reference images not displayed]

FINDINGS: The heart size is normal. Aortic calcifications are noted. There is
a subtle density projecting over the inferior aspect of the right
scapula. There is no pneumothorax or large pleural effusion. Mild
multilevel degenerative changes are noted in the thoracic spine.
IMPRESSION: 1. No acute cardiopulmonary process.
2. Subtle opacity projecting over the inferior right scapula. This
is favored to represent artifact from a confluence of shadows. A
follow-up two-view chest x-ray is recommended in 4-6 weeks to
confirm stability or resolution of this finding.

## 2020-12-28 ENCOUNTER — Telehealth: Payer: Self-pay | Admitting: Family Medicine

## 2020-12-28 NOTE — Telephone Encounter (Signed)
Outbound call placed to notify patient forms ready; dialed number patient listed on intake form. Voicemail full. Daughter of patient notified.

## 2020-12-28 NOTE — Telephone Encounter (Signed)
Patient dropped off disability parking placard renewal form; due 12/29/20. Patient aware providers request 7 days for form completion.   Patient requesting call when form ready for pick up; ok to leave detailed message if no one answers.  Form placed on Katie's desk.   Please advise at [phone]

## 2020-12-28 NOTE — Telephone Encounter (Signed)
Forms completed

## 2021-01-05 ENCOUNTER — Ambulatory Visit (INDEPENDENT_AMBULATORY_CARE_PROVIDER_SITE_OTHER): Payer: Medicare HMO | Admitting: Family Medicine

## 2021-01-05 ENCOUNTER — Encounter: Payer: Self-pay | Admitting: Family Medicine

## 2021-01-05 ENCOUNTER — Other Ambulatory Visit: Payer: Self-pay

## 2021-01-05 VITALS — BP 128/84 | HR 68 | Ht 63.0 in | Wt 230.0 lb

## 2021-01-05 DIAGNOSIS — M25562 Pain in left knee: Secondary | ICD-10-CM | POA: Diagnosis not present

## 2021-01-05 DIAGNOSIS — M25561 Pain in right knee: Secondary | ICD-10-CM | POA: Diagnosis not present

## 2021-01-05 DIAGNOSIS — Z23 Encounter for immunization: Secondary | ICD-10-CM

## 2021-01-05 MED ORDER — MELOXICAM 7.5 MG PO TABS: 7.5000 mg | ORAL_TABLET | ORAL | 0 refills | Status: DC | PRN

## 2021-01-05 NOTE — Progress Notes (Signed)
Subjective:    Patient ID: Bonnie Bennett, female    DOB: November 27, 1931, 87 y.o.   MRN: [id number]  HPI  Patient presents today for pain behind both knees.  She has a history of bilateral knee replacements performed prior to 2011.  Recently she has been doing more lifting and caring around the house.  She is been moving boxes.  As result she developed pain and stiffness behind her knees in both the medial and lateral joint lines bilaterally.  She rested and stopped doing the strenuous activity and the pain has improved.  There is no erythema or effusion today in either knee.  There is no warmth.  She has no pain with flexion and extension of either knee.  There is no tenderness to palpation along the joint lines. Past Medical History:  Diagnosis Date   HLD (hyperlipidemia)    Hypertension    Current Outpatient Medications on File Prior to Visit  Medication Sig Dispense Refill   amLODipine (NORVASC) 5 MG tablet TAKE 1 TABLET BY MOUTH EVERY DAY 90 tablet 0   aspirin EC 81 MG tablet Take 81 mg by mouth daily. Reported on 06/12/2015     atorvastatin (LIPITOR) 10 MG tablet TAKE 1 TABLET BY MOUTH EVERY DAY 90 tablet 0   lisinopril-hydrochlorothiazide (ZESTORETIC) 20-25 MG tablet TAKE 1 TABLET BY MOUTH EVERY DAY 90 tablet 2   Multiple Vitamin (MULTIVITAMIN) tablet Take 1 tablet by mouth daily.     psyllium (METAMUCIL) 58.6 % packet Take 1 packet by mouth daily.     No current facility-administered medications on file prior to visit.   No Known Allergies Social History   Socioeconomic History   Marital status: Widowed    Spouse name: Not on file   Number of children: 7   Years of education: Not on file   Highest education level: Not on file  Occupational History   Not on file  Tobacco Use   Smoking status: Former    Packs/day: 0.25    Years: 45.00    Pack years: 11.25    Types: Cigarettes    Quit date: 43    Years since quitting: 32.9   Smokeless tobacco: Never  Vaping Use    Vaping Use: Never used  Substance and Sexual Activity   Alcohol use: Yes    Comment: occasional   Drug use: No   Sexual activity: Not on file  Other Topics Concern   Not on file  Social History Narrative   Not on file   Social Determinants of Health   Financial Resource Strain: Not on file  Food Insecurity: Not on file  Transportation Needs: Not on file  Physical Activity: Not on file  Stress: Not on file  Social Connections: Not on file  Intimate Partner Violence: Not on file      Review of Systems     Objective:   Physical Exam Vitals reviewed.  Constitutional:      General: She is not in acute distress.    Appearance: Normal appearance. She is obese. She is not ill-appearing.  Cardiovascular:     Rate and Rhythm: Normal rate and regular rhythm.     Heart sounds: Normal heart sounds. No murmur heard. Pulmonary:     Effort: Pulmonary effort is normal. No respiratory distress.     Breath sounds: Normal breath sounds. No stridor. No wheezing or rales.  Musculoskeletal:     Right knee: No swelling, effusion, erythema or ecchymosis. Normal range of  motion. No tenderness.     Left knee: No swelling, effusion, erythema or ecchymosis. Normal range of motion. No tenderness.  Neurological:     Mental Status: She is alert.         Assessment & Plan:  Need for influenza vaccination - Plan: Flu Vaccine QUAD High Dose(Fluad)  Acute pain of both knees I suspect irritation due to activity likely related to her underlying degenerative joint disease.  She does have chronic kidney disease with a GFR of approximately 50 on lab work obtained a year ago.  Therefore I told her that she could take meloxicam 7.5 mg/day as needed sparingly for knee pain.  I explained the potential toxicity to the kidney and advised her not to take it with any frequency but I do believe taking it occasionally if her knees really hurt would be safe.  Also politely requested that she let me check lab work  including a CBC and a CMP which she politely refused.  She states that she is at an age where she does not care if there is an issue in her kidneys or liver and she does not want to know.  Therefore she declines any additional lab work.  She did allow [place] to give her a flu shot

## 2021-01-07 ENCOUNTER — Ambulatory Visit: Payer: Medicare HMO

## 2021-02-12 ENCOUNTER — Ambulatory Visit (INDEPENDENT_AMBULATORY_CARE_PROVIDER_SITE_OTHER): Payer: Medicare HMO

## 2021-02-12 ENCOUNTER — Other Ambulatory Visit: Payer: Self-pay

## 2021-02-12 VITALS — Ht 63.0 in | Wt 230.0 lb

## 2021-02-12 DIAGNOSIS — Z5941 Food insecurity: Secondary | ICD-10-CM

## 2021-02-12 DIAGNOSIS — Z0000 Encounter for general adult medical examination without abnormal findings: Secondary | ICD-10-CM

## 2021-02-12 NOTE — Progress Notes (Signed)
Subjective:   Bonnie Bennett is a 88 y.o. female who presents for Medicare Annual (Subsequent) preventive examination. Virtual Visit via Telephone Note  I connected with  [name] on 02/12/21 at 10:30 AM EST by telephone and verified that I am speaking with the correct person using two identifiers.  Location: Patient: Home Provider: [facility] Persons participating in the virtual visit: patient/Nurse Health Advisor   I discussed the limitations, risks, security and privacy concerns of performing an evaluation and management service by telephone and the availability of in person appointments. The patient expressed understanding and agreed to proceed.  Interactive audio and video telecommunications were attempted between this nurse and patient, however failed, due to patient having technical difficulties OR patient did not have access to video capability.  We continued and completed visit with audio only.  Some vital signs may be absent or patient reported.   Darral Dash, LPN  Review of Systems     Cardiac Risk Factors include: advanced age (>65men, >45 women);hypertension;dyslipidemia;sedentary lifestyle;obesity (BMI >30kg/m2)  PHONE VISIT. PT AT HOME. NURSE AT [facility].    Objective:    Today's Vitals   02/12/21 1030  Weight: 230 lb (104.3 kg)  Height: 5\' 3"  (1.6 m)   Body mass index is 40.74 kg/m.  Advanced Directives 02/12/2021  Does Patient Have a Medical Advance Directive? No  Would patient like information on creating a medical advance directive? No - Patient declined    Current Medications (verified) Outpatient Encounter Medications as of 02/12/2021  Medication Sig   amLODipine (NORVASC) 5 MG tablet TAKE 1 TABLET BY MOUTH EVERY DAY   aspirin EC 81 MG tablet Take 81 mg by mouth daily. Reported on 06/12/2015   atorvastatin (LIPITOR) 10 MG tablet TAKE 1 TABLET BY MOUTH EVERY DAY   lisinopril-hydrochlorothiazide (ZESTORETIC) 20-25 MG tablet TAKE 1 TABLET BY  MOUTH EVERY DAY   meloxicam (MOBIC) 7.5 MG tablet Take 1 tablet (7.5 mg total) by mouth as needed for pain (once a day sparingly for joint pain).   Multiple Vitamin (MULTIVITAMIN) tablet Take 1 tablet by mouth daily.   psyllium (METAMUCIL) 58.6 % packet Take 1 packet by mouth daily.   No facility-administered encounter medications on file as of 02/12/2021.    Allergies (verified) Patient has no known allergies.   History: Past Medical History:  Diagnosis Date   HLD (hyperlipidemia)    Hypertension    Past Surgical History:  Procedure Laterality Date   ABDOMINAL HYSTERECTOMY     KNEE SURGERY Bilateral    Family History  Problem Relation Age of Onset   Kidney failure Mother    Stroke Father    Heart disease Sister    Heart disease Brother    Kidney disease Sister    Social History   Socioeconomic History   Marital status: Widowed    Spouse name: Not on file   Number of children: 7   Years of education: Not on file   Highest education level: Not on file  Occupational History   Not on file  Tobacco Use   Smoking status: Former    Packs/day: 0.25    Years: 45.00    Pack years: 11.25    Types: Cigarettes    Quit date: 45    Years since quitting: 33.0   Smokeless tobacco: Never  Vaping Use   Vaping Use: Never used  Substance and Sexual Activity   Alcohol use: Yes    Comment: occasional   Drug use:  No   Sexual activity: Not on file  Other Topics Concern   Not on file  Social History Narrative   6 children living. 1 son died in 2021.   Social Determinants of Health   Financial Resource Strain: Medium Risk   Difficulty of Paying Living Expenses: Somewhat hard  Food Insecurity: Food Insecurity Present   Worried About Running Out of Food in the Last Year: Sometimes true   Ran Out of Food in the Last Year: Sometimes true  Transportation Needs: No Transportation Needs   Lack of Transportation (Medical): No   Lack of Transportation (Non-Medical): No  Physical  Activity: Insufficiently Active   Days of Exercise per Week: 3 days   Minutes of Exercise per Session: 20 min  Stress: No Stress Concern Present   Feeling of Stress : Not at all  Social Connections: Moderately Integrated   Frequency of Communication with Friends and Family: More than three times a week   Frequency of Social Gatherings with Friends and Family: More than three times a week   Attends Religious Services: More than 4 times per year   Active Member of [employer] or Organizations: Yes   Attends [employer] or Organization Meetings: More than 4 times per year   Marital Status: Widowed    Tobacco Counseling Counseling given: Not Answered   Clinical Intake:  Pre-visit preparation completed: Yes  Pain : No/denies pain     BMI - recorded: 40.74 Nutritional Status: BMI > 30  Obese Nutritional Risks: None Diabetes: No  How often do you need to have someone help you when you read instructions, pamphlets, or other written materials from your doctor or pharmacy?: 1 - Never  Diabetic?No  Interpreter Needed?: No  Information entered by :: MJ Lisaann Atha, LPN   Activities of Daily Living In your present state of health, do you have any difficulty performing the following activities: 02/12/2021  Hearing? N  Vision? N  Difficulty concentrating or making decisions? N  Walking or climbing stairs? N  Dressing or bathing? N  Doing errands, shopping? N  Preparing Food and eating ? N  Using the Toilet? N  In the past six months, have you accidently leaked urine? Y  Comment At times.  Do you have problems with loss of bowel control? N  Managing your Medications? N  Managing your Finances? N  Housekeeping or managing your Housekeeping? N  Some recent data might be hidden    Patient Care Team: Donita BrooksPickard, Warren T, MD as PCP - General (Family Medicine)  Indicate any recent Medical Services you may have received from other than [facility] providers in the past year (date may be approximate).      Assessment:   This is a routine wellness examination for Bonnie Bennett.  Hearing/Vision screen Hearing Screening - Comments:: Some hearing issues. Vision Screening - Comments:: Glasses. 2021. Dr. Nile RiggsShapiro.  Dietary issues and exercise activities discussed: Current Exercise Habits: Home exercise routine, Type of exercise: stretching;Other - see comments (Chair exercises), Time (Minutes): 20, Frequency (Times/Week): 3, Weekly Exercise (Minutes/Week): 60, Intensity: Mild, Exercise limited by: cardiac condition(s)   Goals Addressed             This Visit's Progress    Exercise 3x per week (30 min per time)       Try to increase exercises as tolerated and lose weight. Get glasses fixed and pay off some bills.       Depression Screen PHQ 2/9 Scores 02/12/2021 08/13/2020 05/30/2016 05/30/2016 05/20/2014  PHQ -  2 Score 0 0 0 0 0  PHQ- 9 Score - - - 0 -    Fall Risk Fall Risk  02/12/2021 08/13/2020 05/30/2016 05/20/2014  Falls in the past year? 1 0 No No  Number falls in past yr: 0 0 - -  Injury with Fall? 0 0 - -  Risk for fall due to : Impaired balance/gait;Impaired mobility;History of fall(s) No Fall Risks - -  Follow up Falls prevention discussed Falls evaluation completed - -    FALL RISK PREVENTION PERTAINING TO THE HOME:  Any stairs in or around the home? Yes  If so, are there any without handrails? No  Home free of loose throw rugs in walkways, pet beds, electrical cords, etc? Yes  Adequate lighting in your home to reduce risk of falls? Yes   ASSISTIVE DEVICES UTILIZED TO PREVENT FALLS:  Life alert? No  Use of a cane, walker or w/c? Yes  Grab bars in the bathroom? No  Shower chair or bench in shower? Yes  Elevated toilet seat or a handicapped toilet? Yes   TIMED UP AND GO:  Was the test performed? No .  Phone visit.   Cognitive Function:     6CIT Screen 02/12/2021  What Year? 0 points  What month? 0 points  What time? 0 points  Count back from 20 0 points  Months in  reverse 0 points  Repeat phrase 0 points  Total Score 0    Immunizations Immunization History  Administered Date(s) Administered   Fluad Quad(high Dose 65+) 01/23/2020, 01/05/2021   Influenza Whole 11/13/2012   Influenza, High Dose Seasonal PF 11/24/2017   Influenza, Quadrivalent, Recombinant, Inj, Pf 10/31/2018   Influenza,inj,Quad PF,6+ Mos 01/21/2014, 12/17/2015   Janssen (J&J) SARS-COV-2 Vaccination 07/12/2019   Pneumococcal Conjugate-13 01/21/2014   Pneumococcal Polysaccharide-23 10/31/2018    TDAP status: Due, Education has been provided regarding the importance of this vaccine. Advised may receive this vaccine at local pharmacy or Health Dept. Aware to provide a copy of the vaccination record if obtained from local pharmacy or Health Dept. Verbalized acceptance and understanding.  Flu Vaccine status: Up to date  Pneumococcal vaccine status: Up to date  Covid-19 vaccine status: Completed vaccines  Qualifies for Shingles Vaccine? Yes   Zostavax completed  Pt declined.   Shingrix Completed?: No.    Education has been provided regarding the importance of this vaccine. Patient has been advised to call insurance company to determine out of pocket expense if they have not yet received this vaccine. Advised may also receive vaccine at local pharmacy or Health Dept. Verbalized acceptance and understanding.  Screening Tests Health Maintenance  Topic Date Due   TETANUS/TDAP  Never done   DEXA SCAN  Never done   COVID-19 Vaccine (2 - Booster for Janssen series) 09/06/2019   Zoster Vaccines- Shingrix (1 of 2) 04/06/2021 (Originally 08/02/1981)   Pneumonia Vaccine 7465+ Years old  Completed   INFLUENZA VACCINE  Completed   HPV VACCINES  Aged Out    Health Maintenance  Health Maintenance Due  Topic Date Due   TETANUS/TDAP  Never done   DEXA SCAN  Never done   COVID-19 Vaccine (2 - Booster for Janssen series) 09/06/2019    Colorectal cancer screening: No longer required.    Mammogram status: No longer required due to age.  Bone Density status: Ordered No longer required due to age.[name]. Pt provided with contact info and advised to call to schedule appt.  Lung Cancer Screening: (Low Dose  CT Chest recommended if Age 61-80 years, 30 pack-year currently smoking OR have quit w/in 15years.) does not qualify.    Additional Screening:  Hepatitis C Screening: does not qualify.  Vision Screening: Recommended annual ophthalmology exams for early detection of glaucoma and other disorders of the eye. Is the patient up to date with their annual eye exam?  No  Who is the provider or what is the name of the office in which the patient attends annual eye exams? Dr. Nile Riggs If pt is not established with a provider, would they like to be referred to a provider to establish care? No .   Dental Screening: Recommended annual dental exams for proper oral hygiene  Community Resource Referral / Chronic Care Management: CRR required this visit?  No   CCM required this visit?  Yes      Plan:     I have personally reviewed and noted the following in the patients chart:   Medical and social history Use of alcohol, tobacco or illicit drugs  Current medications and supplements including opioid prescriptions.  Functional ability and status Nutritional status Physical activity Advanced directives List of other physicians Hospitalizations, surgeries, and ER visits in previous 12 months Vitals Screenings to include cognitive, depression, and falls Referrals and appointments  In addition, I have reviewed and discussed with patient certain preventive protocols, quality metrics, and best practice recommendations. A written personalized care plan for preventive services as well as general preventive health recommendations were provided to patient.     Darral Dash, LPN   06/16/9355   Nurse Notes: Pt c/o issues with food insecurity at times. Offered [facility] referral. Pt is  agreeable and referral placed. Overdue for eye exam. Pt states she will call in the next few weeks to schedule. Discussed Shingrix vaccine and how to obtain.

## 2021-02-12 NOTE — Patient Instructions (Signed)
Bonnie Bennett , Thank you for taking time to come for your Medicare Wellness Visit. I appreciate your ongoing commitment to your health goals. Please review the following plan we discussed and let me know if I can assist you in the future.   Screening recommendations/referrals: Colonoscopy: No longer required due to age. Mammogram: No longer required due to age.  Bone Density: No longer required due to age.  Recommended yearly ophthalmology/optometry visit for glaucoma screening and checkup Recommended yearly dental visit for hygiene and checkup  Vaccinations: Influenza vaccine: Done 01/05/2021 Repeat annually  Pneumococcal vaccine: Done 121/15/2015 and 10/31/2018. Tdap vaccine: Due Repeat in 10 years  Shingles vaccine: Shingrix discussed. Please contact your pharmacy for coverage information.     Covid-19:Done 07/12/2019  Advanced directives: Advance directive discussed with you today. Even though you declined this today, please call our office should you change your mind, and we can give you the proper paperwork for you to fill out.   Conditions/risks identified: Keep up the good work!! Aim for 30 minutes of exercise each day, drink 6-8 glasses of water and eat lots of fruits and vegetables.   Next appointment: Follow up in one year for your annual wellness visit 2024.   Preventive Care 70 Years and Older, Female Preventive care refers to lifestyle choices and visits with your health care provider that can promote health and wellness. What does preventive care include? A yearly physical exam. This is also called an annual well check. Dental exams once or twice a year. Routine eye exams. Ask your health care provider how often you should have your eyes checked. Personal lifestyle choices, including: Daily care of your teeth and gums. Regular physical activity. Eating a healthy diet. Avoiding tobacco and drug use. Limiting alcohol use. Practicing safe sex. Taking low-dose  aspirin every day. Taking vitamin and mineral supplements as recommended by your health care provider. What happens during an annual well check? The services and screenings done by your health care provider during your annual well check will depend on your age, overall health, lifestyle risk factors, and family history of disease. Counseling  Your health care provider may ask you questions about your: Alcohol use. Tobacco use. Drug use. Emotional well-being. Home and relationship well-being. Sexual activity. Eating habits. History of falls. Memory and ability to understand (cognition). Work and work Astronomer. Reproductive health. Screening  You may have the following tests or measurements: Height, weight, and BMI. Blood pressure. Lipid and cholesterol levels. These may be checked every 5 years, or more frequently if you are over 2 years old. Skin check. Lung cancer screening. You may have this screening every year starting at age 22 if you have a 30-pack-year history of smoking and currently smoke or have quit within the past 15 years. Fecal occult blood test (FOBT) of the stool. You may have this test every year starting at age 22. Flexible sigmoidoscopy or colonoscopy. You may have a sigmoidoscopy every 5 years or a colonoscopy every 10 years starting at age 90. Hepatitis C blood test. Hepatitis B blood test. Sexually transmitted disease (STD) testing. Diabetes screening. This is done by checking your blood sugar (glucose) after you have not eaten for a while (fasting). You may have this done every 1-3 years. Bone density scan. This is done to screen for osteoporosis. You may have this done starting at age 7. Mammogram. This may be done every 1-2 years. Talk to your health care provider about how often you should have regular mammograms. Talk  with your health care provider about your test results, treatment options, and if necessary, the need for more tests. Vaccines  Your  health care provider may recommend certain vaccines, such as: Influenza vaccine. This is recommended every year. Tetanus, diphtheria, and acellular pertussis (Tdap, Td) vaccine. You may need a Td booster every 10 years. Zoster vaccine. You may need this after age 13. Pneumococcal 13-valent conjugate (PCV13) vaccine. One dose is recommended after age 89. Pneumococcal polysaccharide (PPSV23) vaccine. One dose is recommended after age 4. Talk to your health care provider about which screenings and vaccines you need and how often you need them. This information is not intended to replace advice given to you by your health care provider. Make sure you discuss any questions you have with your health care provider. Document Released: 02/20/2015 Document Revised: 10/14/2015 Document Reviewed: 11/25/2014 Elsevier Interactive Patient Education  2017 [employer].  Fall Prevention in the Home Falls can cause injuries. They can happen to people of all ages. There are many things you can do to make your home safe and to help prevent falls. What can I do on the outside of my home? Regularly fix the edges of walkways and driveways and fix any cracks. Remove anything that might make you trip as you walk through a door, such as a raised step or threshold. Trim any bushes or trees on the path to your home. Use bright outdoor lighting. Clear any walking paths of anything that might make someone trip, such as rocks or tools. Regularly check to see if handrails are loose or broken. Make sure that both sides of any steps have handrails. Any raised decks and porches should have guardrails on the edges. Have any leaves, snow, or ice cleared regularly. Use sand or salt on walking paths during winter. Clean up any spills in your garage right away. This includes oil or grease spills. What can I do in the bathroom? Use night lights. Install grab bars by the toilet and in the tub and shower. Do not use towel bars as  grab bars. Use non-skid mats or decals in the tub or shower. If you need to sit down in the shower, use a plastic, non-slip stool. Keep the floor dry. Clean up any water that spills on the floor as soon as it happens. Remove soap buildup in the tub or shower regularly. Attach bath mats securely with double-sided non-slip rug tape. Do not have throw rugs and other things on the floor that can make you trip. What can I do in the bedroom? Use night lights. Make sure that you have a light by your bed that is easy to reach. Do not use any sheets or blankets that are too big for your bed. They should not hang down onto the floor. Have a firm chair that has side arms. You can use this for support while you get dressed. Do not have throw rugs and other things on the floor that can make you trip. What can I do in the kitchen? Clean up any spills right away. Avoid walking on wet floors. Keep items that you use a lot in easy-to-reach places. If you need to reach something above you, use a strong step stool that has a grab bar. Keep electrical cords out of the way. Do not use floor polish or wax that makes floors slippery. If you must use wax, use non-skid floor wax. Do not have throw rugs and other things on the floor that can make you  trip. What can I do with my stairs? Do not leave any items on the stairs. Make sure that there are handrails on both sides of the stairs and use them. Fix handrails that are broken or loose. Make sure that handrails are as long as the stairways. Check any carpeting to make sure that it is firmly attached to the stairs. Fix any carpet that is loose or worn. Avoid having throw rugs at the top or bottom of the stairs. If you do have throw rugs, attach them to the floor with carpet tape. Make sure that you have a light switch at the top of the stairs and the bottom of the stairs. If you do not have them, ask someone to add them for you. What else can I do to help prevent  falls? Wear shoes that: Do not have high heels. Have rubber bottoms. Are comfortable and fit you well. Are closed at the toe. Do not wear sandals. If you use a stepladder: Make sure that it is fully opened. Do not climb a closed stepladder. Make sure that both sides of the stepladder are locked into place. Ask someone to hold it for you, if possible. Clearly mark and make sure that you can see: Any grab bars or handrails. First and last steps. Where the edge of each step is. Use tools that help you move around (mobility aids) if they are needed. These include: Canes. Walkers. Scooters. Crutches. Turn on the lights when you go into a dark area. Replace any light bulbs as soon as they burn out. Set up your furniture so you have a clear path. Avoid moving your furniture around. If any of your floors are uneven, fix them. If there are any pets around you, be aware of where they are. Review your medicines with your doctor. Some medicines can make you feel dizzy. This can increase your chance of falling. Ask your doctor what other things that you can do to help prevent falls. This information is not intended to replace advice given to you by your health care provider. Make sure you discuss any questions you have with your health care provider. Document Released: 11/20/2008 Document Revised: 07/02/2015 Document Reviewed: 02/28/2014 Elsevier Interactive Patient Education  2017 [employer].

## 2021-02-26 ENCOUNTER — Telehealth: Payer: Self-pay | Admitting: *Deleted

## 2021-02-26 NOTE — Telephone Encounter (Signed)
° °  Telephone encounter was:  Unsuccessful.  02/26/2021 Name: Bonnie Bennett MRN: [id number] DOB: 07/19/1931  Unsuccessful outbound call made today to assist with:  Food Insecurity  Outreach Attempt:  2nd Attempt  No answer just rang   Alois Cliche -[facility] Guide , Embedded Care Coordination [facility], Care Management  [phone] [address] [place] , [place] [address] Email : [email or web address]

## 2021-02-26 NOTE — Telephone Encounter (Signed)
° °  Telephone encounter was:  Unsuccessful.  02/26/2021 Name: Bonnie Bennett MRN: [id number] DOB: 1931/04/11  Unsuccessful outbound call made today to assist with:  Food Insecurity  Outreach Attempt:  1st Attempt  A HIPAA compliant voice message was left requesting a return call.  Instructed patient to call back at   Instructed patient to call back at [phone]  at their earliest convenience. Yehuda Mao Greenauer -[facility] Guide , Embedded Care Coordination [facility], Care Management  [phone] [address] [place] , [place] [address] Email : [email or web address]

## 2021-03-04 ENCOUNTER — Telehealth: Payer: Self-pay | Admitting: *Deleted

## 2021-03-04 NOTE — Telephone Encounter (Signed)
° °  Telephone encounter was:  Unsuccessful.  03/04/2021 Name: NAO LINZ MRN: [id number] DOB: 1931/10/01  Unsuccessful outbound call made today to assist with:  Food Insecurity  Outreach Attempt:  3rd Attempt.  Referral closed unable to contact patient.  Unable to leave message  Alois Cliche -[facility] Guide , Embedded Care Coordination [facility], Care Management  [phone] [address] [place] , [place] [address] Email : [email or web address]

## 2021-11-29 ENCOUNTER — Ambulatory Visit (INDEPENDENT_AMBULATORY_CARE_PROVIDER_SITE_OTHER): Payer: Medicare HMO | Admitting: Family Medicine

## 2021-11-29 VITALS — BP 138/78 | HR 62 | Ht 63.0 in | Wt 225.6 lb

## 2021-11-29 DIAGNOSIS — M5432 Sciatica, left side: Secondary | ICD-10-CM

## 2021-11-29 MED ORDER — PREDNISONE 20 MG PO TABS: ORAL_TABLET | ORAL | 0 refills | Status: DC

## 2021-11-29 NOTE — Progress Notes (Signed)
Subjective:    Patient ID: Bonnie Bennett, female    DOB: Apr 14, 1931, 88 y.o.   MRN: [id number]  Back Pain  Dizziness  3 days ago, the patient was bending over to pick up a 40 pound bag of pellets when she felt pain in her lower back.  The pain is located in the center of her back roughly around the level of L5.  She is also having some pain radiate into her left gluteus and felt some tingling in her left leg.  She reports pain with ambulation and pain with standing and pain with range of motion.  She also has some mild dizziness this morning however at the present time she denies any vertigo, orthostatic dizziness, syncope, near syncope.  She states that she is no longer dizzy. Past Medical History:  Diagnosis Date   HLD (hyperlipidemia)    Hypertension    Current Outpatient Medications on File Prior to Visit  Medication Sig Dispense Refill   amLODipine (NORVASC) 5 MG tablet TAKE 1 TABLET BY MOUTH EVERY DAY 90 tablet 0   aspirin EC 81 MG tablet Take 81 mg by mouth daily. Reported on 06/12/2015     atorvastatin (LIPITOR) 10 MG tablet TAKE 1 TABLET BY MOUTH EVERY DAY 90 tablet 0   lisinopril-hydrochlorothiazide (ZESTORETIC) 20-25 MG tablet TAKE 1 TABLET BY MOUTH EVERY DAY 90 tablet 2   meloxicam (MOBIC) 7.5 MG tablet Take 1 tablet (7.5 mg total) by mouth as needed for pain (once a day sparingly for joint pain). 30 tablet 0   Multiple Vitamin (MULTIVITAMIN) tablet Take 1 tablet by mouth daily.     psyllium (METAMUCIL) 58.6 % packet Take 1 packet by mouth daily.     No current facility-administered medications on file prior to visit.   No Known Allergies Social History   Socioeconomic History   Marital status: Widowed    Spouse name: Not on file   Number of children: 7   Years of education: Not on file   Highest education level: Not on file  Occupational History   Not on file  Tobacco Use   Smoking status: Former    Packs/day: 0.25    Years: 45.00    Total pack years: 11.25     Types: Cigarettes    Quit date: 53    Years since quitting: 33.8   Smokeless tobacco: Never  Vaping Use   Vaping Use: Never used  Substance and Sexual Activity   Alcohol use: Yes    Comment: occasional   Drug use: No   Sexual activity: Not on file  Other Topics Concern   Not on file  Social History Narrative   6 children living. 1 son died in 21-Jun-2019.   Social Determinants of Health   Financial Resource Strain: Medium Risk (02/12/2021)   Overall Financial Resource Strain (CARDIA)    Difficulty of Paying Living Expenses: Somewhat hard  Food Insecurity: Food Insecurity Present (02/12/2021)   Hunger Vital Sign    Worried About Running Out of Food in the Last Year: Sometimes true    Ran Out of Food in the Last Year: Sometimes true  Transportation Needs: No Transportation Needs (02/12/2021)   PRAPARE - Administrator, Civil Service (Medical): No    Lack of Transportation (Non-Medical): No  Physical Activity: Insufficiently Active (02/12/2021)   Exercise Vital Sign    Days of Exercise per Week: 3 days    Minutes of Exercise per Session: 20 min  Stress:  No Stress Concern Present (02/12/2021)   [place]    Feeling of Stress : Not at all  Social Connections: Moderately Integrated (02/12/2021)   Social Connection and Isolation Panel [NHANES]    Frequency of Communication with Friends and Family: More than three times a week    Frequency of Social Gatherings with Friends and Family: More than three times a week    Attends Religious Services: More than 4 times per year    Active Member of [employer] or Organizations: Yes    Attends Archivist Meetings: More than 4 times per year    Marital Status: Widowed  Intimate Partner Violence: Not At Risk (02/12/2021)   Humiliation, Afraid, Rape, and Kick questionnaire    Fear of Current or Ex-Partner: No    Emotionally Abused: No    Physically Abused: No    Sexually  Abused: No      Review of Systems  Musculoskeletal:  Positive for back pain.  Neurological:  Positive for dizziness.       Objective:   Physical Exam Vitals reviewed.  Constitutional:      General: She is not in acute distress.    Appearance: Normal appearance. She is obese. She is not ill-appearing.  Cardiovascular:     Rate and Rhythm: Normal rate and regular rhythm.     Heart sounds: Normal heart sounds. No murmur heard. Pulmonary:     Effort: Pulmonary effort is normal. No respiratory distress.     Breath sounds: Normal breath sounds. No stridor. No wheezing or rales.  Abdominal:     General: Abdomen is flat. Bowel sounds are normal. There is no distension.     Palpations: Abdomen is soft. There is no mass.     Tenderness: There is no abdominal tenderness. There is no guarding or rebound.     Hernia: No hernia is present.  Musculoskeletal:     Lumbar back: No spasms, tenderness or bony tenderness. Decreased range of motion. Negative right straight leg raise test and negative left straight leg raise test.       Back:  Neurological:     Mental Status: She is alert.     Patient has 5 out of 5 muscle strength equal and symmetric in both lower extremities with normal sensation     Assessment & Plan:  Left sided sciatica - Plan: DG Lumbar Spine Complete  I believe the patient herniated a disc in her back or possibly pulled a muscle or combination of both.  She is been taking meloxicam with minimal improvement of the last 3-day.  I recommended she temporarily hold meloxicam, proceed to get an x-ray of her lumbar spine, and begin a prednisone taper pack.  Reassess in 1 week if no better or sooner if worse

## 2022-02-25 ENCOUNTER — Ambulatory Visit (INDEPENDENT_AMBULATORY_CARE_PROVIDER_SITE_OTHER): Payer: Medicare HMO

## 2022-02-25 VITALS — BP 160/80 | HR 85 | Temp 98.2°F | Ht 63.0 in | Wt 228.0 lb

## 2022-02-25 DIAGNOSIS — Z0000 Encounter for general adult medical examination without abnormal findings: Secondary | ICD-10-CM | POA: Diagnosis not present

## 2022-02-25 NOTE — Progress Notes (Signed)
Subjective:   Bonnie Bennett is a 89 y.o. female who presents for Medicare Annual (Subsequent) preventive examination.  Review of Systems     Cardiac Risk Factors include: advanced age (>26men, >73 women);obesity (BMI >30kg/m2)     Objective:    Today's Vitals   02/25/22 0929 02/25/22 0933  BP: (!) 170/80 (!) 160/80  Pulse: 85   Temp: 98.2 F (36.8 C)   TempSrc: Oral   SpO2: 99%   Weight: 228 lb (103.4 kg)   Height: 5\' 3"  (1.6 m)    Body mass index is 40.39 kg/m.     02/25/2022    9:57 AM 02/12/2021   10:49 AM  Advanced Directives  Does Patient Have a Medical Advance Directive? No No  Would patient like information on creating a medical advance directive?  No - Patient declined    Current Medications (verified) Outpatient Encounter Medications as of 02/25/2022  Medication Sig   amLODipine (NORVASC) 5 MG tablet TAKE 1 TABLET BY MOUTH EVERY DAY   aspirin EC 81 MG tablet Take 81 mg by mouth daily. Reported on 06/12/2015   atorvastatin (LIPITOR) 10 MG tablet TAKE 1 TABLET BY MOUTH EVERY DAY   lisinopril-hydrochlorothiazide (ZESTORETIC) 20-25 MG tablet TAKE 1 TABLET BY MOUTH EVERY DAY   Multiple Vitamin (MULTIVITAMIN) tablet Take 1 tablet by mouth daily.   psyllium (METAMUCIL) 58.6 % packet Take 1 packet by mouth daily.   meloxicam (MOBIC) 7.5 MG tablet Take 1 tablet (7.5 mg total) by mouth as needed for pain (once a day sparingly for joint pain). (Patient not taking: Reported on 02/25/2022)   predniSONE (DELTASONE) 20 MG tablet 3 tabs poqday 1-2, 2 tabs poqday 3-4, 1 tab poqday 5-6 (Patient not taking: Reported on 02/25/2022)   No facility-administered encounter medications on file as of 02/25/2022.    Allergies (verified) Patient has no known allergies.   History: Past Medical History:  Diagnosis Date   HLD (hyperlipidemia)    Hypertension    Past Surgical History:  Procedure Laterality Date   ABDOMINAL HYSTERECTOMY     KNEE SURGERY Bilateral    Family  History  Problem Relation Age of Onset   Kidney failure Mother    Stroke Father    Heart disease Sister    Heart disease Brother    Kidney disease Sister    Social History   Socioeconomic History   Marital status: Widowed    Spouse name: Not on file   Number of children: 7   Years of education: Not on file   Highest education level: Not on file  Occupational History   Not on file  Tobacco Use   Smoking status: Former    Packs/day: 0.25    Years: 45.00    Total pack years: 11.25    Types: Cigarettes    Quit date: 62    Years since quitting: 34.0   Smokeless tobacco: Never  Vaping Use   Vaping Use: Never used  Substance and Sexual Activity   Alcohol use: Yes    Comment: occasional   Drug use: No   Sexual activity: Not on file  Other Topics Concern   Not on file  Social History Narrative   6 children living. 1 son died in 05-17-19.   Social Determinants of Health   Financial Resource Strain: Medium Risk (02/25/2022)   Overall Financial Resource Strain (CARDIA)    Difficulty of Paying Living Expenses: Somewhat hard  Food Insecurity: No Food Insecurity (02/25/2022)   Hunger  Vital Sign    Worried About Programme researcher, broadcasting/film/video in the Last Year: Never true    Ran Out of Food in the Last Year: Never true  Transportation Needs: No Transportation Needs (02/25/2022)   PRAPARE - Administrator, Civil Service (Medical): No    Lack of Transportation (Non-Medical): No  Physical Activity: Insufficiently Active (02/25/2022)   Exercise Vital Sign    Days of Exercise per Week: 3 days    Minutes of Exercise per Session: 20 min  Stress: No Stress Concern Present (02/25/2022)   [employer] of Occupational Health - Occupational Stress Questionnaire    Feeling of Stress : Not at all  Social Connections: Moderately Integrated (02/25/2022)   Social Connection and Isolation Panel [NHANES]    Frequency of Communication with Friends and Family: Three times a week    Frequency of  Social Gatherings with Friends and Family: Three times a week    Attends Religious Services: 1 to 4 times per year    Active Member of Clubs or Organizations: Yes    Attends Banker Meetings: More than 4 times per year    Marital Status: Widowed    Tobacco Counseling Counseling given: Not Answered   Clinical Intake:  Pre-visit preparation completed: Yes  Pain : No/denies pain     BMI - recorded: 40.39 Nutritional Status: BMI > 30  Obese Diabetes: No  How often do you need to have someone help you when you read instructions, pamphlets, or other written materials from your doctor or pharmacy?: 5 - Always What is the last grade level you completed in school?: GED  Diabetic?no  Interpreter Needed?: No      Activities of Daily Living    02/25/2022    9:58 AM  In your present state of health, do you have any difficulty performing the following activities:  Hearing? 0  Vision? 0  Difficulty concentrating or making decisions? 1  Walking or climbing stairs? 0  Comment railings  Dressing or bathing? 1  Doing errands, shopping? 1  Comment some times  Preparing Food and eating ? Y  Using the Toilet? Y  In the past six months, have you accidently leaked urine? Y  Do you have problems with loss of bowel control? N  Managing your Medications? Y  Managing your Finances? Y  Comment sometimes help kids  Housekeeping or managing your Housekeeping? Y    Patient Care Team: Donita Brooks, MD as PCP - General (Family Medicine)  Indicate any recent Medical Services you may have received from other than [facility] providers in the past year (date may be approximate).     Assessment:   This is a routine wellness examination for [place].  Hearing/Vision screen No results found.  Dietary issues and exercise activities discussed: Exercise limited by: orthopedic condition(s)   Goals Addressed             This Visit's Progress    Exercise 3x per week (30 min  per time)   On track    Try to increase exercises as tolerated and lose weight. Get glasses fixed and pay off some bills.       Depression Screen    11/29/2021    3:16 PM 02/12/2021   10:40 AM 08/13/2020    2:26 PM 05/30/2016   10:58 AM 05/30/2016    8:42 AM 05/20/2014   11:38 AM  PHQ 2/9 Scores  PHQ - 2 Score 0 0 0 0 0  0  PHQ- 9 Score     0     Fall Risk    02/25/2022    9:25 AM 11/29/2021    3:16 PM 02/12/2021   10:49 AM 08/13/2020    2:26 PM 05/30/2016   10:58 AM  [place] in the past year? 0 0 1 0 No  Number falls in past yr:  0 0 0   Injury with Fall?  0 0 0   Risk for fall due to :  No Fall Risks Impaired balance/gait;Impaired mobility;History of fall(s) No Fall Risks   Follow up  Falls prevention discussed Falls prevention discussed Falls evaluation completed     FALL RISK PREVENTION PERTAINING TO THE HOME:  Any stairs in or around the home? Yes  If so, are there any without handrails? Yes  Home free of loose throw rugs in walkways, pet beds, electrical cords, etc? Yes  Adequate lighting in your home to reduce risk of falls? Yes   ASSISTIVE DEVICES UTILIZED TO PREVENT FALLS:  Life alert? No  Use of a cane, walker or w/c? No  Grab bars in the bathroom? No  Shower chair or bench in shower? Yes  Elevated toilet seat or a handicapped toilet? Yes   TIMED UP AND GO:  Was the test performed? Yes .  Length of time to ambulate 10 feet: 5 sec.   Gait steady and fast without use of assistive device  Cognitive Function:        02/25/2022   10:06 AM 02/12/2021   10:56 AM  6CIT Screen  What Year? 0 points 0 points  What month? 0 points 0 points  What time? 0 points 0 points  Count back from 20 0 points 0 points  Months in reverse 0 points 0 points  Repeat phrase 0 points 0 points  Total Score 0 points 0 points    Immunizations Immunization History  Administered Date(s) Administered   Fluad Quad(high Dose 65+) 01/23/2020, 01/05/2021   Influenza Whole  11/13/2012   Influenza, High Dose Seasonal PF 11/24/2017   Influenza, Quadrivalent, Recombinant, Inj, Pf 10/31/2018   Influenza,inj,Quad PF,6+ Mos 01/21/2014, 12/17/2015   Janssen (J&J) SARS-COV-2 Vaccination 07/12/2019   Pneumococcal Conjugate-13 01/21/2014   Pneumococcal Polysaccharide-23 10/31/2018    TDAP status: Not update, per pt declined. Pt was educated on the importance of getting vaccinated  Flu Vaccine status: Due, Education has been provided regarding the importance of this vaccine. Advised may receive this vaccine at local pharmacy or Health Dept. Aware to provide a copy of the vaccination record if obtained from local pharmacy or Health Dept. Verbalized acceptance and understanding.  Pneumococcal vaccine status: Up to date  Covid-19 vaccine status: Declined, Education has been provided regarding the importance of this vaccine but patient still declined. Advised may receive this vaccine at local pharmacy or Health Dept.or vaccine clinic. Aware to provide a copy of the vaccination record if obtained from local pharmacy or Health Dept. Verbalized acceptance and understanding.  Qualifies for Shingles Vaccine? No   Zostavax completed No   Shingrix Completed?: No.    Education has been provided regarding the importance of this vaccine. Patient has been advised to call insurance company to determine out of pocket expense if they have not yet received this vaccine. Advised may also receive vaccine at local pharmacy or Health Dept. Verbalized acceptance and understanding.  Screening Tests Health Maintenance  Topic Date Due   DTaP/Tdap/Td (1 - Tdap) Never done  Zoster Vaccines- Shingrix (1 of 2) Never done   DEXA SCAN  Never done   INFLUENZA VACCINE  09/07/2021   COVID-19 Vaccine (2 - 2023-24 season) 10/08/2021   Medicare Annual Wellness (AWV)  02/26/2023   Pneumonia Vaccine 62+ Years old  Completed   HPV VACCINES  Aged Out    Health Maintenance  Health Maintenance Due   Topic Date Due   DTaP/Tdap/Td (1 - Tdap) Never done   Zoster Vaccines- Shingrix (1 of 2) Never done   DEXA SCAN  Never done   INFLUENZA VACCINE  09/07/2021   COVID-19 Vaccine (2 - 2023-24 season) 10/08/2021    Colorectal cancer screening: No longer required.   Mammogram status: No longer required due to age.  Dexa Scan: not done, pt aware Lung Cancer Screening: (Low Dose CT Chest recommended if Age 5-80 years, 30 pack-year currently smoking OR have quit w/in 15years.) does not qualify.   Lung Cancer Screening Referral: n/a  Additional Screening:  Hepatitis C Screening: does not qualify; Completed n/a  Vision Screening: Recommended annual ophthalmology exams for early detection of glaucoma and other disorders of the eye. Is the patient up to date with their annual eye exam?  No  Who is the provider or what is the name of the office in which the patient attends annual eye exams? Dr. Robynn Pane If pt is not established with a provider, would they like to be referred to a provider to establish care? No .   Dental Screening: Recommended annual dental exams for proper oral hygiene  Community Resource Referral / Chronic Care Management: CRR required this visit?  No   CCM required this visit?  No      Plan:     I have personally reviewed and noted the following in the patient's chart:   Medical and social history Use of alcohol, tobacco or illicit drugs  Current medications and supplements including opioid prescriptions. Patient is not currently taking opioid prescriptions. Functional ability and status Nutritional status Physical activity Advanced directives List of other physicians Hospitalizations, surgeries, and ER visits in previous 12 months Vitals Screenings to include cognitive, depression, and falls Referrals and appointments  In addition, I have reviewed and discussed with patient certain preventive protocols, quality metrics, and best practice recommendations.  A written personalized care plan for preventive services as well as general preventive health recommendations were provided to patient.     Arta Silence, CMA   02/25/2022   Nurse Notes: pt wanted a referral to dental, sent 02/25/22  BP was high during visit, Pcp aware.

## 2022-05-30 ENCOUNTER — Encounter: Payer: Self-pay | Admitting: Family Medicine

## 2022-05-30 ENCOUNTER — Ambulatory Visit (INDEPENDENT_AMBULATORY_CARE_PROVIDER_SITE_OTHER): Payer: Medicare HMO | Admitting: Family Medicine

## 2022-05-30 VITALS — BP 150/78 | HR 85 | Temp 98.3°F | Ht 63.0 in | Wt 228.0 lb

## 2022-05-30 DIAGNOSIS — I10 Essential (primary) hypertension: Secondary | ICD-10-CM | POA: Diagnosis not present

## 2022-05-30 DIAGNOSIS — M542 Cervicalgia: Secondary | ICD-10-CM | POA: Diagnosis not present

## 2022-05-30 DIAGNOSIS — Z6841 Body Mass Index (BMI) 40.0 and over, adult: Secondary | ICD-10-CM | POA: Diagnosis not present

## 2022-05-30 MED ORDER — LISINOPRIL-HYDROCHLOROTHIAZIDE 20-25 MG PO TABS: 1.0000 | ORAL_TABLET | Freq: Every day | ORAL | 0 refills | Status: DC

## 2022-05-30 NOTE — Assessment & Plan Note (Signed)
Chronic. Uncontrolled. She reports non-compliance with her medicaitions. She is not taking Amlodipine and only occasionally taking her Lisinopril-HCTZ. I provided a courtesy refill of Lisinopril-HCTZ as she reports being out of this and encouraged her to monitor her BP at home and follow-up with PCP for labs and physical as it appears she has not been seen since 2021.

## 2022-05-30 NOTE — Assessment & Plan Note (Signed)
No red flags, pain lasted a few seconds and was relieved. Discussed signs and symptoms of stroke and MI and when to seek medical care.

## 2022-05-30 NOTE — Progress Notes (Signed)
Acute Office Visit  Subjective:     Patient ID: Bonnie Bennett, female    DOB: 04/29/1931, 89 y.o.   MRN: [id number]  Chief Complaint  Patient presents with   Follow-up    Sharp pain from neck up to head while her head was turned trying to put a pin in her sweater on 05/29/22- [facility]      Acute Visit    HPI Patient is in today for a sharp right neck pain while turning her head left that shot up to her forehead and lasted a few seconds. She was concerned she was having a stroke at that time. She took some deep breaths and relaxed and it did not return since. She was also worried about not taking her blood pressure medication every day. Denies chest pain, shortness of breath, palpitations, vision changes, headaches, swelling of extremities, weakness, confusion, difficulty speaking.   Review of Systems  All other systems reviewed and are negative.   Past Medical History:  Diagnosis Date   HLD (hyperlipidemia)    Hypertension    Past Surgical History:  Procedure Laterality Date   ABDOMINAL HYSTERECTOMY     KNEE SURGERY Bilateral    Current Outpatient Medications on File Prior to Visit  Medication Sig Dispense Refill   aspirin EC 81 MG tablet Take 81 mg by mouth daily. Reported on 06/12/2015     meloxicam (MOBIC) 7.5 MG tablet Take 1 tablet (7.5 mg total) by mouth as needed for pain (once a day sparingly for joint pain). 30 tablet 0   Multiple Vitamin (MULTIVITAMIN) tablet Take 1 tablet by mouth daily.     psyllium (METAMUCIL) 58.6 % packet Take 1 packet by mouth daily.     No current facility-administered medications on file prior to visit.   No Known Allergies      Objective:    BP (!) 150/78   Pulse 85   Temp 98.3 F (36.8 C) (Oral)   Ht  (1.6 m)   Wt 228 lb (103.4 kg)   SpO2 99%   BMI 40.39 kg/m    Physical Exam Vitals and nursing note reviewed.  Constitutional:      Appearance: Normal appearance. She is normal weight.  HENT:     Head:  Normocephalic and atraumatic.  Cardiovascular:     Rate and Rhythm: Normal rate and regular rhythm.     Pulses: Normal pulses.     Heart sounds: Normal heart sounds.  Pulmonary:     Effort: Pulmonary effort is normal.     Breath sounds: Normal breath sounds.  Musculoskeletal:     Cervical back: No spinous process tenderness or muscular tenderness. Normal range of motion.  Skin:    General: Skin is warm and dry.  Neurological:     General: No focal deficit present.     Mental Status: She is alert and oriented to person, place, and time. Mental status is at baseline.     Sensory: No sensory deficit.     Motor: No weakness.     Gait: Gait normal.  Psychiatric:        Mood and Affect: Mood normal.        Behavior: Behavior normal.        Thought Content: Thought content normal.        Judgment: Judgment normal.     No results found for any visits on 05/30/22.      Assessment & Plan:   Problem List Items Addressed  This Visit     Hypertension - Primary    Chronic. Uncontrolled. She reports non-compliance with her medicaitions. She is not taking Amlodipine and only occasionally taking her Lisinopril-HCTZ. I provided a courtesy refill of Lisinopril-HCTZ as she reports being out of this and encouraged her to monitor her BP at home and follow-up with PCP for labs and physical as it appears she has not been seen since 2021.      Relevant Medications   lisinopril-hydrochlorothiazide (ZESTORETIC) 20-25 MG tablet   Neck pain    No red flags, pain lasted a few seconds and was relieved. Discussed signs and symptoms of stroke and MI and when to seek medical care.       Meds ordered this encounter  Medications   lisinopril-hydrochlorothiazide (ZESTORETIC) 20-25 MG tablet    Sig: Take 1 tablet by mouth daily.    Dispense:  30 tablet    Refill:  0    Order Specific Question:   Supervising Provider    Answer:   Lynnea Ferrier T [[id number]]    Return for annual physical with  PCP.  Park Meo, FNP

## 2022-06-23 ENCOUNTER — Other Ambulatory Visit: Payer: Self-pay | Admitting: Family Medicine

## 2022-06-30 ENCOUNTER — Ambulatory Visit (INDEPENDENT_AMBULATORY_CARE_PROVIDER_SITE_OTHER): Payer: Medicare HMO | Admitting: Family Medicine

## 2022-06-30 ENCOUNTER — Encounter: Payer: Self-pay | Admitting: Family Medicine

## 2022-06-30 VITALS — BP 132/74 | HR 77 | Temp 97.5°F | Ht 63.0 in | Wt 223.6 lb

## 2022-06-30 DIAGNOSIS — Z0001 Encounter for general adult medical examination with abnormal findings: Secondary | ICD-10-CM

## 2022-06-30 DIAGNOSIS — Z0000 Encounter for general adult medical examination without abnormal findings: Secondary | ICD-10-CM

## 2022-06-30 DIAGNOSIS — I10 Essential (primary) hypertension: Secondary | ICD-10-CM | POA: Diagnosis not present

## 2022-06-30 DIAGNOSIS — E7800 Pure hypercholesterolemia, unspecified: Secondary | ICD-10-CM

## 2022-06-30 NOTE — Progress Notes (Signed)
Subjective:    Patient ID: Bonnie Bennett, female    DOB: 12/26/1931, 89 y.o.   MRN: [id number]  HPI  Patient is a very sweet 89 year old African-American female who is here today for checkup.  She denies any concerns.  She is still driving.  She has excellent insight and very good memory.  She is still active in her yard.  She is planting flowers and working in the garden.  She denies any falls.  She denies any depression.  She does report some shortness of breath with activity however this sounds more like deconditioning.  She denies any chest pain with activity.  She denies any shortness of breath with minimal activity.  She denies any orthopnea or paroxysmal nocturnal dyspnea.  She denies any heaviness or tightness in the chest.  Due to her age, she does not require Pap smear or colonoscopy. Immunization History  Administered Date(s) Administered   Fluad Quad(high Dose 65+) 01/23/2020, 01/05/2021   Influenza Whole 11/13/2012   Influenza, High Dose Seasonal PF 11/24/2017   Influenza, Quadrivalent, Recombinant, Inj, Pf 10/31/2018   Influenza,inj,Quad PF,6+ Mos 01/21/2014, 12/17/2015   Janssen (J&J) SARS-COV-2 Vaccination 07/12/2019   Pneumococcal Conjugate-13 01/21/2014   Pneumococcal Polysaccharide-23 10/31/2018   Her immunizations are up-to-date. Past Medical History:  Diagnosis Date   HLD (hyperlipidemia)    Hypertension    Current Outpatient Medications on File Prior to Visit  Medication Sig Dispense Refill   aspirin EC 81 MG tablet Take 81 mg by mouth daily. Reported on 06/12/2015     lisinopril-hydrochlorothiazide (ZESTORETIC) 20-25 MG tablet TAKE 1 TABLET BY MOUTH EVERY DAY 90 tablet 1   meloxicam (MOBIC) 7.5 MG tablet Take 1 tablet (7.5 mg total) by mouth as needed for pain (once a day sparingly for joint pain). 30 tablet 0   Multiple Vitamin (MULTIVITAMIN) tablet Take 1 tablet by mouth daily.     psyllium (METAMUCIL) 58.6 % packet Take 1 packet by mouth daily.     No  current facility-administered medications on file prior to visit.   No Known Allergies Social History   Socioeconomic History   Marital status: Widowed    Spouse name: Not on file   Number of children: 7   Years of education: Not on file   Highest education level: Not on file  Occupational History   Not on file  Tobacco Use   Smoking status: Former    Packs/day: 0.25    Years: 45.00    Additional pack years: 0.00    Total pack years: 11.25    Types: Cigarettes    Quit date: 62    Years since quitting: 34.4   Smokeless tobacco: Never  Vaping Use   Vaping Use: Never used  Substance and Sexual Activity   Alcohol use: Yes    Comment: occasional   Drug use: No   Sexual activity: Not on file  Other Topics Concern   Not on file  Social History Narrative   6 children living. 1 son died in 2019/07/18.   Social Determinants of Health   Financial Resource Strain: Medium Risk (02/25/2022)   Overall Financial Resource Strain (CARDIA)    Difficulty of Paying Living Expenses: Somewhat hard  Food Insecurity: No Food Insecurity (02/25/2022)   Hunger Vital Sign    Worried About Running Out of Food in the Last Year: Never true    Ran Out of Food in the Last Year: Never true  Transportation Needs: No Transportation Needs (02/25/2022)  PRAPARE - Administrator, Civil Service (Medical): No    Lack of Transportation (Non-Medical): No  Physical Activity: Insufficiently Active (02/25/2022)   Exercise Vital Sign    Days of Exercise per Week: 3 days    Minutes of Exercise per Session: 20 min  Stress: No Stress Concern Present (02/25/2022)   [employer] of Occupational Health - Occupational Stress Questionnaire    Feeling of Stress : Not at all  Social Connections: Moderately Integrated (02/25/2022)   Social Connection and Isolation Panel [NHANES]    Frequency of Communication with Friends and Family: Three times a week    Frequency of Social Gatherings with Friends and Family:  Three times a week    Attends Religious Services: 1 to 4 times per year    Active Member of Clubs or Organizations: Yes    Attends Banker Meetings: More than 4 times per year    Marital Status: Widowed  Intimate Partner Violence: Not At Risk (02/25/2022)   Humiliation, Afraid, Rape, and Kick questionnaire    Fear of Current or Ex-Partner: No    Emotionally Abused: No    Physically Abused: No    Sexually Abused: No      Review of Systems     Objective:   Physical Exam Vitals reviewed.  Constitutional:      General: She is not in acute distress.    Appearance: Normal appearance. She is obese. She is not ill-appearing, toxic-appearing or diaphoretic.  HENT:     Head: Normocephalic and atraumatic.     Right Ear: Tympanic membrane and ear canal normal.     Left Ear: Tympanic membrane and ear canal normal.     Nose: No congestion or rhinorrhea.     Mouth/Throat:     Mouth: Mucous membranes are moist.     Pharynx: Oropharynx is clear. No oropharyngeal exudate or posterior oropharyngeal erythema.  Eyes:     Extraocular Movements: Extraocular movements intact.     Conjunctiva/sclera: Conjunctivae normal.     Pupils: Pupils are equal, round, and reactive to light.  Neck:     Vascular: No carotid bruit.  Cardiovascular:     Rate and Rhythm: Normal rate and regular rhythm.     Heart sounds: Normal heart sounds. No murmur heard. Pulmonary:     Effort: Pulmonary effort is normal. No respiratory distress.     Breath sounds: Normal breath sounds. No stridor. No wheezing, rhonchi or rales.  Chest:     Chest wall: No tenderness.  Abdominal:     General: Abdomen is flat. Bowel sounds are normal. There is no distension.     Palpations: Abdomen is soft.     Tenderness: There is no abdominal tenderness. There is no guarding or rebound.  Musculoskeletal:     Cervical back: No tenderness.     Right knee: No swelling, effusion, erythema or ecchymosis. Normal range of motion.  No tenderness.     Left knee: No swelling, effusion, erythema or ecchymosis. Normal range of motion. No tenderness.     Right lower leg: No edema.     Left lower leg: No edema.  Lymphadenopathy:     Cervical: No cervical adenopathy.  Skin:    General: Skin is warm.     Coloration: Skin is not jaundiced.     Findings: No bruising, erythema, lesion or rash.  Neurological:     General: No focal deficit present.     Mental Status: She is alert  and oriented to person, place, and time. Mental status is at baseline.     Cranial Nerves: No cranial nerve deficit.     Motor: No weakness.     Coordination: Coordination normal.     Gait: Gait normal.  Psychiatric:        Mood and Affect: Mood normal.        Behavior: Behavior normal.        Thought Content: Thought content normal.        Judgment: Judgment normal.          Assessment & Plan:  Benign essential HTN - Plan: CBC with Differential/Platelet, Lipid panel, COMPLETE METABOLIC PANEL WITH GFR  Encounter for Medicare annual wellness exam  Pure hypercholesterolemia Patient has declined all statins for her hyperlipidemia.  I asked her to at least take fish oil 2000 mg a day.  Her blood pressure today is excellent.  I will check a CBC a CMP and a lipid panel.  Her pneumonia vaccines are up-to-date.  Flu shot is not available yet.  Due to her age she does not require Pap smear or colonoscopy.  She does not demonstrate any memory loss.  She denies any falls.  She is still very active.  She denies any depression

## 2022-07-01 LAB — CBC WITH DIFFERENTIAL/PLATELET
Absolute Monocytes: 689 cells/uL (ref 200–950)
Basophils Absolute: 41 cells/uL (ref 0–200)
Basophils Relative: 0.5 %
Eosinophils Absolute: 89 cells/uL (ref 15–500)
Eosinophils Relative: 1.1 %
HCT: 39.4 % (ref 35.0–45.0)
Hemoglobin: 12.8 g/dL (ref 11.7–15.5)
Lymphs Abs: 3264 cells/uL (ref 850–3900)
MCH: 29.6 pg (ref 27.0–33.0)
MCHC: 32.5 g/dL (ref 32.0–36.0)
MCV: 91.2 fL (ref 80.0–100.0)
MPV: 11.2 fL (ref 7.5–12.5)
Monocytes Relative: 8.5 %
Neutro Abs: 4018 cells/uL (ref 1500–7800)
Neutrophils Relative %: 49.6 %
Platelets: 249 10*3/uL (ref 140–400)
RBC: 4.32 10*6/uL (ref 3.80–5.10)
RDW: 13.5 % (ref 11.0–15.0)
Total Lymphocyte: 40.3 %
WBC: 8.1 10*3/uL (ref 3.8–10.8)

## 2022-07-01 LAB — LIPID PANEL
Cholesterol: 301 mg/dL — ABNORMAL HIGH (ref <200)
HDL: 56 mg/dL (ref >=50)
LDL Cholesterol (Calc): 221 mg/dL (calc) — ABNORMAL HIGH
Non-HDL Cholesterol (Calc): 245 mg/dL (calc) — ABNORMAL HIGH (ref <130)
Total CHOL/HDL Ratio: 5.4 (calc) — ABNORMAL HIGH (ref <5.0)
Triglycerides: 106 mg/dL (ref <150)

## 2022-07-01 LAB — COMPLETE METABOLIC PANEL WITH GFR
AG Ratio: 1.4 (calc) (ref 1.0–2.5)
ALT: 5 U/L — ABNORMAL LOW (ref 6–29)
AST: 14 U/L (ref 10–35)
Albumin: 4.2 g/dL (ref 3.6–5.1)
Alkaline phosphatase (APISO): 54 U/L (ref 37–153)
BUN/Creatinine Ratio: 18 (calc) (ref 6–22)
BUN: 23 mg/dL (ref 7–25)
CO2: 27 mmol/L (ref 20–32)
Calcium: 9.7 mg/dL (ref 8.6–10.4)
Chloride: 108 mmol/L (ref 98–110)
Creat: 1.28 mg/dL — ABNORMAL HIGH (ref 0.60–0.95)
Globulin: 3.1 g/dL (calc) (ref 1.9–3.7)
Glucose, Bld: 93 mg/dL (ref 65–99)
Potassium: 5.2 mmol/L (ref 3.5–5.3)
Sodium: 142 mmol/L (ref 135–146)
Total Bilirubin: 0.4 mg/dL (ref 0.2–1.2)
Total Protein: 7.3 g/dL (ref 6.1–8.1)
eGFR: 40 mL/min/{1.73_m2} — ABNORMAL LOW (ref >=60)

## 2022-12-20 ENCOUNTER — Ambulatory Visit: Payer: Medicare HMO

## 2022-12-20 VITALS — BP 148/86 | HR 76

## 2022-12-20 DIAGNOSIS — I10 Essential (primary) hypertension: Secondary | ICD-10-CM

## 2022-12-20 NOTE — Progress Notes (Signed)
Pt c/o elevated BP at home of 158/108. Here today for BP check. Pt states she is taking her medications as ordered. BP check today of 148/86. Appointment made to see Dr. Tanya Nones on 12/22/2022. Advised pt to continue to take medications and if BP goes higher or if she experiences chest pain, SOB, dizziness or blurred vision for pt to go to ER. Pt verbalized understanding of all. Mjp,lpn

## 2022-12-22 ENCOUNTER — Ambulatory Visit (INDEPENDENT_AMBULATORY_CARE_PROVIDER_SITE_OTHER): Payer: Medicare HMO | Admitting: Family Medicine

## 2022-12-22 VITALS — BP 148/84 | HR 72 | Temp 97.6°F | Ht 63.0 in | Wt 225.0 lb

## 2022-12-22 DIAGNOSIS — I10 Essential (primary) hypertension: Secondary | ICD-10-CM

## 2022-12-22 MED ORDER — AMLODIPINE BESYLATE 10 MG PO TABS: 10.0000 mg | ORAL_TABLET | Freq: Every day | ORAL | 3 refills | Status: DC

## 2022-12-22 MED ORDER — LISINOPRIL-HYDROCHLOROTHIAZIDE 20-25 MG PO TABS: 1.0000 | ORAL_TABLET | Freq: Every day | ORAL | 3 refills | Status: DC

## 2022-12-22 NOTE — Progress Notes (Signed)
Subjective:    Patient ID: Bonnie Bennett, female    DOB: [birth year], 89 y.o.   MRN: [id number]  Hypertension   Patient is a very sweet 89 year old African-American female who despite taking her Zestoretic 20/25 daily has seen blood pressures approaching 200/100 at home.  I rechecked her blood pressure today and found it to be in the 176/100 range in both arms.  She denies any chest pain shortness of breath or dyspnea on exertion.  She denies any headaches.  She denies any blurry vision.  She denies any hematuria. Past Medical History:  Diagnosis Date   HLD (hyperlipidemia)    Hypertension    Current Outpatient Medications on File Prior to Visit  Medication Sig Dispense Refill   aspirin EC 81 MG tablet Take 81 mg by mouth daily. Reported on 06/12/2015     lisinopril-hydrochlorothiazide (ZESTORETIC) 20-25 MG tablet TAKE 1 TABLET BY MOUTH EVERY DAY 90 tablet 1   meloxicam (MOBIC) 7.5 MG tablet Take 1 tablet (7.5 mg total) by mouth as needed for pain (once a day sparingly for joint pain). 30 tablet 0   Multiple Vitamin (MULTIVITAMIN) tablet Take 1 tablet by mouth daily.     psyllium (METAMUCIL) 58.6 % packet Take 1 packet by mouth daily.     No current facility-administered medications on file prior to visit.   No Known Allergies Social History   Socioeconomic History   Marital status: Widowed    Spouse name: Not on file   Number of children: 7   Years of education: Not on file   Highest education level: Not on file  Occupational History   Not on file  Tobacco Use   Smoking status: Former    Current packs/day: 0.00    Average packs/day: 0.3 packs/day for 45.0 years (11.3 ttl pk-yrs)    Types: Cigarettes    Start date: 66    Quit date: 37    Years since quitting: 34.8   Smokeless tobacco: Never  Vaping Use   Vaping status: Never Used  Substance and Sexual Activity   Alcohol use: Yes    Comment: occasional   Drug use: No   Sexual activity: Not on file  Other  Topics Concern   Not on file  Social History Narrative   6 children living. 1 son died in January 20, 2020.   Social Determinants of Health   Financial Resource Strain: Medium Risk (02/25/2022)   Overall Financial Resource Strain (CARDIA)    Difficulty of Paying Living Expenses: Somewhat hard  Food Insecurity: No Food Insecurity (02/25/2022)   Hunger Vital Sign    Worried About Running Out of Food in the Last Year: Never true    Ran Out of Food in the Last Year: Never true  Transportation Needs: No Transportation Needs (02/25/2022)   PRAPARE - Administrator, Civil Service (Medical): No    Lack of Transportation (Non-Medical): No  Physical Activity: Insufficiently Active (02/25/2022)   Exercise Vital Sign    Days of Exercise per Week: 3 days    Minutes of Exercise per Session: 20 min  Stress: No Stress Concern Present (02/25/2022)   [employer] of Occupational Health - Occupational Stress Questionnaire    Feeling of Stress : Not at all  Social Connections: Moderately Integrated (02/25/2022)   Social Connection and Isolation Panel [NHANES]    Frequency of Communication with Friends and Family: Three times a week    Frequency of Social Gatherings with Friends and Family: Three times  a week    Attends Religious Services: 1 to 4 times per year    Active Member of Clubs or Organizations: Yes    Attends Banker Meetings: More than 4 times per year    Marital Status: Widowed  Intimate Partner Violence: Not At Risk (02/25/2022)   Humiliation, Afraid, Rape, and Kick questionnaire    Fear of Current or Ex-Partner: No    Emotionally Abused: No    Physically Abused: No    Sexually Abused: No      Review of Systems     Objective:   Physical Exam Vitals reviewed.  Constitutional:      General: She is not in acute distress.    Appearance: Normal appearance. She is obese. She is not ill-appearing, toxic-appearing or diaphoretic.  HENT:     Head: Normocephalic and  atraumatic.     Right Ear: Tympanic membrane and ear canal normal.     Left Ear: Tympanic membrane and ear canal normal.     Nose: No congestion or rhinorrhea.     Mouth/Throat:     Mouth: Mucous membranes are moist.     Pharynx: Oropharynx is clear. No oropharyngeal exudate or posterior oropharyngeal erythema.  Eyes:     Extraocular Movements: Extraocular movements intact.     Conjunctiva/sclera: Conjunctivae normal.     Pupils: Pupils are equal, round, and reactive to light.  Neck:     Vascular: No carotid bruit.  Cardiovascular:     Rate and Rhythm: Normal rate and regular rhythm.     Heart sounds: Normal heart sounds. No murmur heard. Pulmonary:     Effort: Pulmonary effort is normal. No respiratory distress.     Breath sounds: Normal breath sounds. No stridor. No wheezing, rhonchi or rales.  Chest:     Chest wall: No tenderness.  Abdominal:     General: Abdomen is flat. Bowel sounds are normal. There is no distension.     Palpations: Abdomen is soft.     Tenderness: There is no abdominal tenderness. There is no guarding or rebound.  Musculoskeletal:     Cervical back: No tenderness.     Right knee: No swelling, effusion, erythema or ecchymosis. Normal range of motion. No tenderness.     Left knee: No swelling, effusion, erythema or ecchymosis. Normal range of motion. No tenderness.     Right lower leg: No edema.     Left lower leg: No edema.  Lymphadenopathy:     Cervical: No cervical adenopathy.  Skin:    General: Skin is warm.     Coloration: Skin is not jaundiced.     Findings: No bruising, erythema, lesion or rash.  Neurological:     General: No focal deficit present.     Mental Status: She is alert and oriented to person, place, and time. Mental status is at baseline.     Cranial Nerves: No cranial nerve deficit.     Motor: No weakness.     Coordination: Coordination normal.     Gait: Gait normal.  Psychiatric:        Mood and Affect: Mood normal.         Behavior: Behavior normal.        Thought Content: Thought content normal.        Judgment: Judgment normal.          Assessment & Plan:   Benign essential HTN - Plan: BASIC METABOLIC PANEL WITH GFR No evidence of hypertensive emergency.  Start amlodipine 10 mg a day continue Zestoretic.  Check CMP to renal unction.  Recheck blood pressure next week.

## 2022-12-23 LAB — BASIC METABOLIC PANEL WITH GFR
BUN/Creatinine Ratio: 19 (calc) (ref 6–22)
BUN: 27 mg/dL — ABNORMAL HIGH (ref 7–25)
CO2: 29 mmol/L (ref 20–32)
Calcium: 9.9 mg/dL (ref 8.6–10.4)
Chloride: 104 mmol/L (ref 98–110)
Creat: 1.43 mg/dL — ABNORMAL HIGH (ref 0.60–0.95)
Glucose, Bld: 103 mg/dL — ABNORMAL HIGH (ref 65–99)
Potassium: 5 mmol/L (ref 3.5–5.3)
Sodium: 141 mmol/L (ref 135–146)
eGFR: 35 mL/min/{1.73_m2} — ABNORMAL LOW (ref >=60)

## 2022-12-29 ENCOUNTER — Ambulatory Visit (INDEPENDENT_AMBULATORY_CARE_PROVIDER_SITE_OTHER): Payer: Medicare HMO | Admitting: Family Medicine

## 2022-12-29 ENCOUNTER — Encounter: Payer: Self-pay | Admitting: Family Medicine

## 2022-12-29 VITALS — BP 128/72 | HR 72 | Temp 98.1°F | Ht 63.0 in | Wt 224.1 lb

## 2022-12-29 DIAGNOSIS — I10 Essential (primary) hypertension: Secondary | ICD-10-CM

## 2022-12-29 MED ORDER — AMLODIPINE BESYLATE 5 MG PO TABS: 5.0000 mg | ORAL_TABLET | Freq: Every day | ORAL | 3 refills | Status: DC

## 2022-12-29 NOTE — Progress Notes (Signed)
Subjective:    Patient ID: Bonnie Bennett, female    DOB: [birth year], 89 y.o.   MRN: [id number]  Hypertension   Last week, I added amlodipine 10 mg a day for hypertension.  The patient states her blood pressure has been 116-120/70-80.  She does feel little bit dizzy.  She denies any chest pain or shortness of breath.  Her renal function had increased on her last BMP.  GFR had fallen below 40 Past Medical History:  Diagnosis Date   HLD (hyperlipidemia)    Hypertension    Current Outpatient Medications on File Prior to Visit  Medication Sig Dispense Refill   amLODipine (NORVASC) 10 MG tablet Take 1 tablet (10 mg total) by mouth daily. 90 tablet 3   aspirin EC 81 MG tablet Take 81 mg by mouth daily. Reported on 06/12/2015     lisinopril-hydrochlorothiazide (ZESTORETIC) 20-25 MG tablet Take 1 tablet by mouth daily. 90 tablet 3   meloxicam (MOBIC) 7.5 MG tablet Take 1 tablet (7.5 mg total) by mouth as needed for pain (once a day sparingly for joint pain). 30 tablet 0   Multiple Vitamin (MULTIVITAMIN) tablet Take 1 tablet by mouth daily.     psyllium (METAMUCIL) 58.6 % packet Take 1 packet by mouth daily.     No current facility-administered medications on file prior to visit.   No Known Allergies Social History   Socioeconomic History   Marital status: Widowed    Spouse name: Not on file   Number of children: 7   Years of education: Not on file   Highest education level: Not on file  Occupational History   Not on file  Tobacco Use   Smoking status: Former    Current packs/day: 0.00    Average packs/day: 0.3 packs/day for 45.0 years (11.3 ttl pk-yrs)    Types: Cigarettes    Start date: 43    Quit date: 25    Years since quitting: 34.9   Smokeless tobacco: Never  Vaping Use   Vaping status: Never Used  Substance and Sexual Activity   Alcohol use: Yes    Comment: occasional   Drug use: No   Sexual activity: Not on file  Other Topics Concern   Not on file  Social  History Narrative   6 children living. 1 son died in 01-25-2020.   Social Determinants of Health   Financial Resource Strain: Medium Risk (02/25/2022)   Overall Financial Resource Strain (CARDIA)    Difficulty of Paying Living Expenses: Somewhat hard  Food Insecurity: No Food Insecurity (02/25/2022)   Hunger Vital Sign    Worried About Running Out of Food in the Last Year: Never true    Ran Out of Food in the Last Year: Never true  Transportation Needs: No Transportation Needs (02/25/2022)   PRAPARE - Administrator, Civil Service (Medical): No    Lack of Transportation (Non-Medical): No  Physical Activity: Insufficiently Active (02/25/2022)   Exercise Vital Sign    Days of Exercise per Week: 3 days    Minutes of Exercise per Session: 20 min  Stress: No Stress Concern Present (02/25/2022)   [employer] of Occupational Health - Occupational Stress Questionnaire    Feeling of Stress : Not at all  Social Connections: Moderately Integrated (02/25/2022)   Social Connection and Isolation Panel [NHANES]    Frequency of Communication with Friends and Family: Three times a week    Frequency of Social Gatherings with Friends and Family: Three  times a week    Attends Religious Services: 1 to 4 times per year    Active Member of Clubs or Organizations: Yes    Attends Banker Meetings: More than 4 times per year    Marital Status: Widowed  Intimate Partner Violence: Not At Risk (02/25/2022)   Humiliation, Afraid, Rape, and Kick questionnaire    Fear of Current or Ex-Partner: No    Emotionally Abused: No    Physically Abused: No    Sexually Abused: No      Review of Systems     Objective:   Physical Exam Vitals reviewed.  Constitutional:      General: She is not in acute distress.    Appearance: Normal appearance. She is obese. She is not ill-appearing, toxic-appearing or diaphoretic.  HENT:     Head: Normocephalic and atraumatic.     Right Ear: Tympanic  membrane and ear canal normal.     Left Ear: Tympanic membrane and ear canal normal.     Nose: No congestion or rhinorrhea.     Mouth/Throat:     Mouth: Mucous membranes are moist.     Pharynx: Oropharynx is clear. No oropharyngeal exudate or posterior oropharyngeal erythema.  Eyes:     Extraocular Movements: Extraocular movements intact.     Conjunctiva/sclera: Conjunctivae normal.     Pupils: Pupils are equal, round, and reactive to light.  Neck:     Vascular: No carotid bruit.  Cardiovascular:     Rate and Rhythm: Normal rate and regular rhythm.     Heart sounds: Normal heart sounds. No murmur heard. Pulmonary:     Effort: Pulmonary effort is normal. No respiratory distress.     Breath sounds: Normal breath sounds. No stridor. No wheezing, rhonchi or rales.  Chest:     Chest wall: No tenderness.  Abdominal:     General: Abdomen is flat. Bowel sounds are normal. There is no distension.     Palpations: Abdomen is soft.     Tenderness: There is no abdominal tenderness. There is no guarding or rebound.  Musculoskeletal:     Cervical back: No tenderness.     Right knee: No swelling, effusion, erythema or ecchymosis. Normal range of motion. No tenderness.     Left knee: No swelling, effusion, erythema or ecchymosis. Normal range of motion. No tenderness.     Right lower leg: No edema.     Left lower leg: No edema.  Lymphadenopathy:     Cervical: No cervical adenopathy.  Skin:    General: Skin is warm.     Coloration: Skin is not jaundiced.     Findings: No bruising, erythema, lesion or rash.  Neurological:     General: No focal deficit present.     Mental Status: She is alert and oriented to person, place, and time. Mental status is at baseline.     Cranial Nerves: No cranial nerve deficit.     Motor: No weakness.     Coordination: Coordination normal.     Gait: Gait normal.  Psychiatric:        Mood and Affect: Mood normal.        Behavior: Behavior normal.        Thought  Content: Thought content normal.        Judgment: Judgment normal.          Assessment & Plan:  Benign essential HTN - Plan: BASIC METABOLIC PANEL WITH GFR Patient has been trying to drink  more water so I will recheck a BMP to monitor renal function.  Blood pressure is much better.  Decrease amlodipine to 5 mg a day to avoid hypotension.

## 2022-12-30 LAB — BASIC METABOLIC PANEL WITH GFR
BUN/Creatinine Ratio: 19 (calc) (ref 6–22)
BUN: 25 mg/dL (ref 7–25)
CO2: 25 mmol/L (ref 20–32)
Calcium: 9.9 mg/dL (ref 8.6–10.4)
Chloride: 104 mmol/L (ref 98–110)
Creat: 1.35 mg/dL — ABNORMAL HIGH (ref 0.60–0.95)
Glucose, Bld: 94 mg/dL (ref 65–99)
Potassium: 5.2 mmol/L (ref 3.5–5.3)
Sodium: 140 mmol/L (ref 135–146)
eGFR: 38 mL/min/{1.73_m2} — ABNORMAL LOW (ref >=60)

## 2023-03-01 ENCOUNTER — Ambulatory Visit: Payer: Medicare Other | Admitting: Family Medicine

## 2023-03-01 ENCOUNTER — Encounter: Payer: Self-pay | Admitting: Family Medicine

## 2023-03-01 VITALS — BP 160/64 | HR 81 | Temp 97.8°F | Ht 63.0 in | Wt 225.0 lb

## 2023-03-01 DIAGNOSIS — Z0000 Encounter for general adult medical examination without abnormal findings: Secondary | ICD-10-CM | POA: Diagnosis not present

## 2023-03-01 DIAGNOSIS — I10 Essential (primary) hypertension: Secondary | ICD-10-CM | POA: Diagnosis not present

## 2023-03-01 NOTE — Patient Instructions (Signed)
  Bonnie Bennett , Thank you for taking time to come for your Medicare Wellness Visit. I appreciate your ongoing commitment to your health goals. Please review the following plan we discussed and let me know if I can assist you in the future.   These are the goals we discussed:  Goals      Exercise 3x per week (30 min per time)     Try to increase exercises as tolerated and lose weight. Get glasses fixed and pay off some bills.        This is a list of the screening recommended for you and due dates:  Health Maintenance  Topic Date Due   DTaP/Tdap/Td vaccine (1 - Tdap) Never done   Zoster (Shingles) Vaccine (1 of 2) Never done   DEXA scan (bone density measurement)  Never done   Flu Shot  09/08/2022   COVID-19 Vaccine (2 - 2024-25 season) 10/09/2022   Medicare Annual Wellness Visit  02/29/2024   Pneumonia Vaccine  Completed   HPV Vaccine  Aged Out

## 2023-03-01 NOTE — Progress Notes (Signed)
Subjective:   Bonnie Bennett is a 89 y.o. female who presents for Medicare Annual (Subsequent) preventive examination.  Visit Complete: In person  Patient Medicare AWV questionnaire was completed by the patient on 03/01/23; I have confirmed that all information answered by patient is correct and no changes since this date.        Objective:    Today's Vitals   03/01/23 0942  BP: (!) 144/58  Pulse: 81  Temp: 97.8 F (36.6 C)  TempSrc: Oral  SpO2: 97%  Weight: 225 lb (102.1 kg)  Height: 5\' 3"  (1.6 m)   Body mass index is 39.86 kg/m.    03/01/2023    9:42 AM 12/29/2022    2:33 PM 12/22/2022    2:37 PM  Vitals with BMI  Height 5\' 3"  5\' 3"  5\' 3"   Weight 225 lbs 224 lbs 2 oz 225 lbs  BMI 39.87 39.71 39.87  Systolic 144 128 528  Diastolic 58 72 84  Pulse 81 72 72        03/01/2023   10:13 AM 02/25/2022    9:57 AM 02/12/2021   10:49 AM  Advanced Directives  Does Patient Have a Medical Advance Directive? No No No  Would patient like information on creating a medical advance directive?   No - Patient declined    Current Medications (verified) Outpatient Encounter Medications as of 03/01/2023  Medication Sig   amLODipine (NORVASC) 5 MG tablet Take 1 tablet (5 mg total) by mouth daily.   aspirin EC 81 MG tablet Take 81 mg by mouth daily. Reported on 06/12/2015   lisinopril-hydrochlorothiazide (ZESTORETIC) 20-25 MG tablet Take 1 tablet by mouth daily.   meloxicam (MOBIC) 7.5 MG tablet Take 1 tablet (7.5 mg total) by mouth as needed for pain (once a day sparingly for joint pain).   Multiple Vitamin (MULTIVITAMIN) tablet Take 1 tablet by mouth daily.   psyllium (METAMUCIL) 58.6 % packet Take 1 packet by mouth daily.   amLODipine (NORVASC) 10 MG tablet Take 1 tablet (10 mg total) by mouth daily. (Patient not taking: Reported on 03/01/2023)   No facility-administered encounter medications on file as of 03/01/2023.    Allergies (verified) Patient has no known allergies.    History: Past Medical History:  Diagnosis Date   HLD (hyperlipidemia)    Hypertension    Past Surgical History:  Procedure Laterality Date   ABDOMINAL HYSTERECTOMY     KNEE SURGERY Bilateral    Family History  Problem Relation Age of Onset   Kidney failure Mother    Stroke Father    Heart disease Sister    Heart disease Brother    Kidney disease Sister    Social History   Socioeconomic History   Marital status: Widowed    Spouse name: Not on file   Number of children: 7   Years of education: Not on file   Highest education level: Not on file  Occupational History   Not on file  Tobacco Use   Smoking status: Former    Current packs/day: 0.00    Average packs/day: 0.3 packs/day for 45.0 years (11.3 ttl pk-yrs)    Types: Cigarettes    Start date: 51    Quit date: 74    Years since quitting: 35.0   Smokeless tobacco: Never  Vaping Use   Vaping status: Never Used  Substance and Sexual Activity   Alcohol use: Yes    Comment: occasional   Drug use: No   Sexual activity:  Not on file  Other Topics Concern   Not on file  Social History Narrative   6 children living. 1 son died in 04-01-19.   Social Drivers of Health   Financial Resource Strain: Medium Risk (03/01/2023)   Overall Financial Resource Strain (CARDIA)    Difficulty of Paying Living Expenses: Somewhat hard  Food Insecurity: No Food Insecurity (03/01/2023)   Hunger Vital Sign    Worried About Running Out of Food in the Last Year: Never true    Ran Out of Food in the Last Year: Never true  Transportation Needs: No Transportation Needs (03/01/2023)   PRAPARE - Administrator, Civil Service (Medical): No    Lack of Transportation (Non-Medical): No  Physical Activity: Insufficiently Active (03/01/2023)   Exercise Vital Sign    Days of Exercise per Week: 3 days    Minutes of Exercise per Session: 20 min  Stress: No Stress Concern Present (03/01/2023)   [employer] of Occupational Health -  Occupational Stress Questionnaire    Feeling of Stress : Not at all  Social Connections: Moderately Integrated (03/01/2023)   Social Connection and Isolation Panel [NHANES]    Frequency of Communication with Friends and Family: Three times a week    Frequency of Social Gatherings with Friends and Family: Three times a week    Attends Religious Services: 1 to 4 times per year    Active Member of Clubs or Organizations: Yes    Attends Banker Meetings: More than 4 times per year    Marital Status: Widowed    Tobacco Counseling Counseling given: Yes   Clinical Intake:  Pre-visit preparation completed: Yes  Pain : No/denies pain     BMI - recorded: 39.86 Nutritional Status: BMI > 30  Obese Nutritional Risks: Other (Comment) Diabetes: No  How often do you need to have someone help you when you read instructions, pamphlets, or other written materials from your doctor or pharmacy?: 1 - Never What is the last grade level you completed in school?: college degree         Activities of Daily Living    03/01/2023    9:59 AM  In your present state of health, do you have any difficulty performing the following activities:  Hearing? 1  Comment on telephone  Vision? 0  Difficulty concentrating or making decisions? 0  Walking or climbing stairs? 0  Dressing or bathing? 1  Doing errands, shopping? 1    Patient Care Team: Donita Brooks, MD as PCP - General (Family Medicine)  Indicate any recent Medical Services you may have received from other than [facility] providers in the past year (date may be approximate).     Assessment:   This is a routine wellness examination for Bonnie Bennett.  Hearing/Vision screen No results found.   Goals Addressed   None   Depression Screen    03/01/2023   10:15 AM 06/30/2022   10:16 AM 11/29/2021    3:16 PM 02/12/2021   10:40 AM 08/13/2020    2:26 PM 05/30/2016   10:58 AM 05/30/2016    8:42 AM  PHQ 2/9 Scores  PHQ - 2 Score 0 0 0 0 0  0 0  PHQ- 9 Score       0    Fall Risk    06/30/2022   10:16 AM 02/25/2022    9:25 AM 11/29/2021    3:16 PM 02/12/2021   10:49 AM 08/13/2020    2:26 PM  Fall Risk   Falls in the past year? 0 0 0 1 0  Number falls in past yr: 0  0 0 0  Injury with Fall? 0  0 0 0  Risk for fall due to : No Fall Risks  No Fall Risks Impaired balance/gait;Impaired mobility;History of fall(s) No Fall Risks  Follow up Falls prevention discussed  Falls prevention discussed Falls prevention discussed Falls evaluation completed    MEDICARE RISK AT HOME:    TIMED UP AND GO:  Was the test performed?  Yes  Length of time to ambulate 10 feet: 10 sec Gait steady and fast without use of assistive device    Cognitive Function:        03/01/2023   10:01 AM 02/25/2022   10:06 AM 02/12/2021   10:56 AM  6CIT Screen  What Year? 0 points 0 points 0 points  What month? 0 points 0 points 0 points  What time? 0 points 0 points 0 points  Count back from 20 0 points 0 points 0 points  Months in reverse 0 points 0 points 0 points  Repeat phrase 8 points 0 points 0 points  Total Score 8 points 0 points 0 points    Immunizations Immunization History  Administered Date(s) Administered   Fluad Quad(high Dose 65+) 01/23/2020, 01/05/2021   Influenza Whole 11/13/2012   Influenza, High Dose Seasonal PF 11/24/2017   Influenza, Quadrivalent, Recombinant, Inj, Pf 10/31/2018   Influenza,inj,Quad PF,6+ Mos 01/21/2014, 12/17/2015   Janssen (J&J) SARS-COV-2 Vaccination 07/12/2019   Pneumococcal Conjugate-13 01/21/2014   Pneumococcal Polysaccharide-23 10/31/2018    TDAP status: Due, Education has been provided regarding the importance of this vaccine. Advised may receive this vaccine at local pharmacy or Health Dept. Aware to provide a copy of the vaccination record if obtained from local pharmacy or Health Dept. Verbalized acceptance and understanding.  Flu Vaccine status: Completed at today's visit  Pneumococcal vaccine  status: Up to date  Covid-19 vaccine status: Declined, Education has been provided regarding the importance of this vaccine but patient still declined. Advised may receive this vaccine at local pharmacy or Health Dept.or vaccine clinic. Aware to provide a copy of the vaccination record if obtained from local pharmacy or Health Dept. Verbalized acceptance and understanding.  Qualifies for Shingles Vaccine? No   Zostavax completed Yes   Shingrix Completed?: No.    Education has been provided regarding the importance of this vaccine. Patient has been advised to call insurance company to determine out of pocket expense if they have not yet received this vaccine. Advised may also receive vaccine at local pharmacy or Health Dept. Verbalized acceptance and understanding.  Screening Tests Health Maintenance  Topic Date Due   DTaP/Tdap/Td (1 - Tdap) Never done   Zoster Vaccines- Shingrix (1 of 2) Never done   DEXA SCAN  Never done   INFLUENZA VACCINE  09/08/2022   COVID-19 Vaccine (2 - 2024-25 season) 10/09/2022   Medicare Annual Wellness (AWV)  02/29/2024   Pneumonia Vaccine 78+ Years old  Completed   HPV VACCINES  Aged Out    Health Maintenance  Health Maintenance Due  Topic Date Due   DTaP/Tdap/Td (1 - Tdap) Never done   Zoster Vaccines- Shingrix (1 of 2) Never done   DEXA SCAN  Never done   INFLUENZA VACCINE  09/08/2022   COVID-19 Vaccine (2 - 2024-25 season) 10/09/2022    Colorectal cancer screening: No longer required.    Additional Screening:  Hepatitis C Screening: does not  qualify; Completed n/a  Vision Screening: Recommended annual ophthalmology exams for early detection of glaucoma and other disorders of the eye. Is the patient up to date with their annual eye exam?  No  Who is the provider or what is the name of the office in which the patient attends annual eye exams? Dr. Nile Riggs If pt is not established with a provider, would they like to be referred to a provider to  establish care? Yes .   Dental Screening: Recommended annual dental exams for proper oral hygiene Community Resource Referral / Chronic Care Management: CRR required this visit?  No   CCM required this visit?  No     Plan:     I have personally reviewed and noted the following in the patient's chart:   Medical and social history Use of alcohol, tobacco or illicit drugs  Current medications and supplements including opioid prescriptions. Patient is not currently taking opioid prescriptions. Functional ability and status Nutritional status Physical activity Advanced directives List of other physicians Hospitalizations, surgeries, and ER visits in previous 12 months Vitals Screenings to include cognitive, depression, and falls Referrals and appointments  In addition, I have reviewed and discussed with patient certain preventive protocols, quality metrics, and best practice recommendations. A written personalized care plan for preventive services as well as general preventive health recommendations were provided to patient.     Park Meo, FNP   03/01/2023   After Visit Summary: (In Person-Printed) AVS printed and given to the patient  Nurse Notes: n/a

## 2023-03-01 NOTE — Progress Notes (Signed)
Subjective:  HPI: Bonnie Bennett is a 89 y.o. female presenting on 03/01/2023 for medicare annual well visit   HPI Patient is in today for AWV with BP slightly elevated today. Her Amlodipine was decreased from 10mg  daily to 5mg  daily 3 months ago. She is unsure exactly what medications she is taking today and does admit to some inconsistencies.   HYPERTENSION with Chronic Kidney Disease Hypertension status: uncontrolled  Satisfied with current treatment? yes Duration of hypertension: chronic BP monitoring frequency:  not checking BP range:  BP medication side effects:  no Medication compliance: fair compliance Previous BP meds:amlodipine and lisinopril-HCTZ Aspirin: yes Recurrent headaches: no Visual changes: no Palpitations: no Dyspnea: no Chest pain: no Lower extremity edema: no Dizzy/lightheaded: no   Review of Systems  All other systems reviewed and are negative.   Relevant past medical history reviewed and updated as indicated.   Past Medical History:  Diagnosis Date   HLD (hyperlipidemia)    Hypertension      Past Surgical History:  Procedure Laterality Date   ABDOMINAL HYSTERECTOMY     KNEE SURGERY Bilateral     Allergies and medications reviewed and updated.   Current Outpatient Medications:    amLODipine (NORVASC) 5 MG tablet, Take 1 tablet (5 mg total) by mouth daily., Disp: 90 tablet, Rfl: 3   aspirin EC 81 MG tablet, Take 81 mg by mouth daily. Reported on 06/12/2015, Disp: , Rfl:    lisinopril-hydrochlorothiazide (ZESTORETIC) 20-25 MG tablet, Take 1 tablet by mouth daily., Disp: 90 tablet, Rfl: 3   meloxicam (MOBIC) 7.5 MG tablet, Take 1 tablet (7.5 mg total) by mouth as needed for pain (once a day sparingly for joint pain)., Disp: 30 tablet, Rfl: 0   Multiple Vitamin (MULTIVITAMIN) tablet, Take 1 tablet by mouth daily., Disp: , Rfl:    psyllium (METAMUCIL) 58.6 % packet, Take 1 packet by mouth daily., Disp: , Rfl:    amLODipine (NORVASC) 10 MG  tablet, Take 1 tablet (10 mg total) by mouth daily. (Patient not taking: Reported on 03/01/2023), Disp: 90 tablet, Rfl: 3  No Known Allergies  Objective:   BP (!) 160/64   Pulse 81   Temp 97.8 F (36.6 C) (Oral)   Ht 5\' 3"  (1.6 m)   Wt 225 lb (102.1 kg)   SpO2 97%   BMI 39.86 kg/m      03/01/2023   10:25 AM 03/01/2023    9:42 AM 12/29/2022    2:33 PM  Vitals with BMI  Height  5\' 3"  5\' 3"   Weight  225 lbs 224 lbs 2 oz  BMI  39.87 39.71  Systolic 160 144 865  Diastolic 64 58 72  Pulse  81 72     Physical Exam Vitals and nursing note reviewed.  Constitutional:      Appearance: Normal appearance. She is normal weight.  HENT:     Head: Normocephalic and atraumatic.  Cardiovascular:     Rate and Rhythm: Normal rate and regular rhythm.     Pulses: Normal pulses.     Heart sounds: Normal heart sounds.  Pulmonary:     Effort: Pulmonary effort is normal.     Breath sounds: Normal breath sounds.  Skin:    General: Skin is warm and dry.  Neurological:     General: No focal deficit present.     Mental Status: She is alert and oriented to person, place, and time. Mental status is at baseline.  Psychiatric:  Mood and Affect: Mood normal.        Behavior: Behavior normal.        Thought Content: Thought content normal.        Judgment: Judgment normal.     Assessment & Plan:  Encounter for Medicare annual wellness exam  Benign essential HTN Assessment & Plan: BP 144/58 and 160/64 today in office. She is not monitoring at home and not completely sure what medications she is taking. Encouraged to monitor BP at home in AM and record values and return to office to see PCP in 1 week with readings and medications. Take medications as prescribed and bring medications and blood pressure log with cuff to each office visit. Seek medical care for chest pain, palpitations, shortness of breath with exertion, dizziness/lightheadedness, vision changes, recurrent headaches, or  swelling of extremities. Follow up in 1 week with PCP.      Follow up plan: Return in about 1 week (around 03/08/2023) for with PCP for hypertension.  Park Meo, FNP

## 2023-03-01 NOTE — Assessment & Plan Note (Addendum)
BP 144/58 and 160/64 today in office. She is not monitoring at home and not completely sure what medications she is taking. Encouraged to monitor BP at home in AM and record values and return to office to see PCP in 1 week with readings and medications. Take medications as prescribed and bring medications and blood pressure log with cuff to each office visit. Seek medical care for chest pain, palpitations, shortness of breath with exertion, dizziness/lightheadedness, vision changes, recurrent headaches, or swelling of extremities. Follow up in 1 week with PCP.

## 2023-03-09 ENCOUNTER — Ambulatory Visit: Payer: Medicare Other | Admitting: Family Medicine

## 2023-03-09 ENCOUNTER — Other Ambulatory Visit: Payer: Self-pay

## 2023-03-09 ENCOUNTER — Encounter: Payer: Self-pay | Admitting: Family Medicine

## 2023-03-09 ENCOUNTER — Ambulatory Visit: Payer: Medicare Other | Attending: Family Medicine

## 2023-03-09 VITALS — BP 156/76 | HR 67 | Temp 97.6°F | Ht 63.0 in | Wt 225.0 lb

## 2023-03-09 DIAGNOSIS — R002 Palpitations: Secondary | ICD-10-CM

## 2023-03-09 DIAGNOSIS — I10 Essential (primary) hypertension: Secondary | ICD-10-CM

## 2023-03-09 MED ORDER — AMLODIPINE BESYLATE 10 MG PO TABS: 10.0000 mg | ORAL_TABLET | Freq: Every day | ORAL | 3 refills | Status: DC

## 2023-03-09 NOTE — Progress Notes (Unsigned)
EP to read.

## 2023-03-09 NOTE — Progress Notes (Signed)
Subjective:    Patient ID: Bonnie Bennett, female    DOB: 05-18-1931, 89 y.o.   MRN: [id number]  Hypertension  Despite taking lisinopril/hydrochlorothiazide along with amlodipine 5 mg today, the patient's blood pressure has been elevated.  Today is 156/76.  My partner got 160 systolic at a recent visit.  Patient also reports palpitations.  She states that she frequently feels her heart skip and flutter.  She denies any syncope or chest pain or shortness of breath Past Medical History:  Diagnosis Date   HLD (hyperlipidemia)    Hypertension    Current Outpatient Medications on File Prior to Visit  Medication Sig Dispense Refill   aspirin EC 81 MG tablet Take 81 mg by mouth daily. Reported on 06/12/2015     lisinopril-hydrochlorothiazide (ZESTORETIC) 20-25 MG tablet Take 1 tablet by mouth daily. 90 tablet 3   Multiple Vitamin (MULTIVITAMIN) tablet Take 1 tablet by mouth daily.     psyllium (METAMUCIL) 58.6 % packet Take 1 packet by mouth daily.     No current facility-administered medications on file prior to visit.   No Known Allergies Social History   Socioeconomic History   Marital status: Widowed    Spouse name: Not on file   Number of children: 7   Years of education: Not on file   Highest education level: Not on file  Occupational History   Not on file  Tobacco Use   Smoking status: Former    Current packs/day: 0.00    Average packs/day: 0.3 packs/day for 45.0 years (11.3 ttl pk-yrs)    Types: Cigarettes    Start date: 29    Quit date: 68    Years since quitting: 35.1   Smokeless tobacco: Never  Vaping Use   Vaping status: Never Used  Substance and Sexual Activity   Alcohol use: Yes    Comment: occasional   Drug use: No   Sexual activity: Not on file  Other Topics Concern   Not on file  Social History Narrative   6 children living. 1 son died in Apr 06, 2019.   Social Drivers of Health   Financial Resource Strain: Medium Risk (03/01/2023)   Overall Financial  Resource Strain (CARDIA)    Difficulty of Paying Living Expenses: Somewhat hard  Food Insecurity: No Food Insecurity (03/01/2023)   Hunger Vital Sign    Worried About Running Out of Food in the Last Year: Never true    Ran Out of Food in the Last Year: Never true  Transportation Needs: No Transportation Needs (03/01/2023)   PRAPARE - Administrator, Civil Service (Medical): No    Lack of Transportation (Non-Medical): No  Physical Activity: Insufficiently Active (03/01/2023)   Exercise Vital Sign    Days of Exercise per Week: 3 days    Minutes of Exercise per Session: 20 min  Stress: No Stress Concern Present (03/01/2023)   [employer] of Occupational Health - Occupational Stress Questionnaire    Feeling of Stress : Not at all  Social Connections: Moderately Integrated (03/01/2023)   Social Connection and Isolation Panel [NHANES]    Frequency of Communication with Friends and Family: Three times a week    Frequency of Social Gatherings with Friends and Family: Three times a week    Attends Religious Services: 1 to 4 times per year    Active Member of Clubs or Organizations: Yes    Attends Banker Meetings: More than 4 times per year    Marital Status:  Widowed  Intimate Partner Violence: Not At Risk (03/01/2023)   Humiliation, Afraid, Rape, and Kick questionnaire    Fear of Current or Ex-Partner: No    Emotionally Abused: No    Physically Abused: No    Sexually Abused: No      Review of Systems     Objective:   Physical Exam Vitals reviewed.  Constitutional:      General: She is not in acute distress.    Appearance: Normal appearance. She is obese. She is not ill-appearing, toxic-appearing or diaphoretic.  HENT:     Head: Normocephalic and atraumatic.     Right Ear: Tympanic membrane and ear canal normal.     Left Ear: Tympanic membrane and ear canal normal.     Nose: No congestion or rhinorrhea.     Mouth/Throat:     Mouth: Mucous membranes are  moist.     Pharynx: Oropharynx is clear. No oropharyngeal exudate or posterior oropharyngeal erythema.  Eyes:     Extraocular Movements: Extraocular movements intact.     Conjunctiva/sclera: Conjunctivae normal.     Pupils: Pupils are equal, round, and reactive to light.  Neck:     Vascular: No carotid bruit.  Cardiovascular:     Rate and Rhythm: Normal rate and regular rhythm.     Heart sounds: Normal heart sounds. No murmur heard. Pulmonary:     Effort: Pulmonary effort is normal. No respiratory distress.     Breath sounds: Normal breath sounds. No stridor. No wheezing, rhonchi or rales.  Chest:     Chest wall: No tenderness.  Abdominal:     General: Abdomen is flat. Bowel sounds are normal. There is no distension.     Palpations: Abdomen is soft.     Tenderness: There is no abdominal tenderness. There is no guarding or rebound.  Musculoskeletal:     Cervical back: No tenderness.     Right knee: No swelling, effusion, erythema or ecchymosis. Normal range of motion. No tenderness.     Left knee: No swelling, effusion, erythema or ecchymosis. Normal range of motion. No tenderness.     Right lower leg: No edema.     Left lower leg: No edema.  Lymphadenopathy:     Cervical: No cervical adenopathy.  Skin:    General: Skin is warm.     Coloration: Skin is not jaundiced.     Findings: No bruising, erythema, lesion or rash.  Neurological:     General: No focal deficit present.     Mental Status: She is alert and oriented to person, place, and time. Mental status is at baseline.     Cranial Nerves: No cranial nerve deficit.     Motor: No weakness.     Coordination: Coordination normal.     Gait: Gait normal.  Psychiatric:        Mood and Affect: Mood normal.        Behavior: Behavior normal.        Thought Content: Thought content normal.        Judgment: Judgment normal.          Assessment & Plan:  Palpitations - Plan: LONG TERM MONITOR (3-14 DAYS)  Benign essential  HTN Blood pressure is too high today.  Increase amlodipine to 10 mg a day and recheck blood pressure in 2 weeks.  Schedule the patient for a Zio patch 14-day cardiac monitor to evaluate for possible paroxysmal atrial fibrillation

## 2023-03-13 DIAGNOSIS — R002 Palpitations: Secondary | ICD-10-CM | POA: Diagnosis not present

## 2023-03-20 NOTE — Congregational Nurse Program (Unsigned)
 Bonnie Bennett presented asking for blood pressure to be checked. Stated she tried to check at home. Her blood pressure was abnormal elevated. It taken in both arms. I suggested she report to her PCP, she stated she is taking medication. I will follow-up and retake her blood pressure again on Wednesday 03/22/2023

## 2023-03-21 ENCOUNTER — Ambulatory Visit (INDEPENDENT_AMBULATORY_CARE_PROVIDER_SITE_OTHER): Payer: Medicare Other | Admitting: Internal Medicine

## 2023-03-21 ENCOUNTER — Encounter: Payer: Self-pay | Admitting: Internal Medicine

## 2023-03-21 ENCOUNTER — Ambulatory Visit: Payer: Self-pay | Admitting: Family Medicine

## 2023-03-21 VITALS — BP 130/80 | HR 100 | Temp 98.4°F | Ht 63.0 in | Wt 225.0 lb

## 2023-03-21 DIAGNOSIS — I340 Nonrheumatic mitral (valve) insufficiency: Secondary | ICD-10-CM

## 2023-03-21 DIAGNOSIS — I10 Essential (primary) hypertension: Secondary | ICD-10-CM | POA: Diagnosis not present

## 2023-03-21 DIAGNOSIS — E7800 Pure hypercholesterolemia, unspecified: Secondary | ICD-10-CM | POA: Diagnosis not present

## 2023-03-21 DIAGNOSIS — R1013 Epigastric pain: Secondary | ICD-10-CM

## 2023-03-21 DIAGNOSIS — R42 Dizziness and giddiness: Secondary | ICD-10-CM

## 2023-03-21 NOTE — Progress Notes (Signed)
Patient Care Team: Donita Brooks, MD as PCP - General (Family Medicine)  Visit Date: 03/21/23  Subjective:   Chief Complaint  Patient presents with   Dizziness   Patient WU:[id number] Bonnie, Bennett DOB:[birth year],89 y.o. YNW:[id number]   89 y.o. Female presents today for acute sick visit with Dizziness. Here today with her daughter Usually sees Dr. Tanya Nones.Patient has a past medical history of HTN. According to the call center triage note, patient   had elevated blood pressure readings on Sunday, noted to be 202/75, then 198/78, and today 159/82. Last night she did not take her blood pressure medication (10 mg Amlodipine daily, 20-25 mg Lisinopril-Hydrochlorothiazide, and 81 mg ASA daily) . She says she ate some cabbage last eveing and  subsequently experienced nausea, vertigo, chills, and a headache.Symptoms awakened her around 4 am. Elaborating on her dizziness, she noted that it was mild, w/o any lightheadedness, and she was able to ambulate without difficulty. Regarding her nausea, which reportedly onset after midnight. She  denied vomiting, though felt nausea. Also experienced dizziness as she kept getting up to go the bathroom.    Notes that after 2 tsp of Mylanta she felt improved, and this morning she was able to eat oatmeal w/o any symptoms. Endorses hx of reflux and indigestion, for which she takes Metamucil, Mylanta, and Tums as needed.   Last saw her cardiologist on 03/09/23 for palpitations, for which she was placed on a long-term cardiac monitor and is wearing today in-office. Endorses stress as she is widowed, living with her 89 y.o disabled today is 160/60, which did improve to 130/80 10 minutes later. Notes that she forgot to take her blood pressure medication today, and does endorse occasionally missing a dose. Mentions that her legs do swell despite being on a diuretic, but don't pit, though she is able to visualize imprints of her socks after removal.   Past Medical  History:  Diagnosis Date   HLD (hyperlipidemia)    Hypertension     No Known Allergies  Family History  Problem Relation Age of Onset   Kidney failure Mother    Stroke Father    Heart disease Sister    Heart disease Brother    Kidney disease Sister    Social History   Social History Narrative   6 children living. 1 son died in April 10, 2019.   Review of Systems  Constitutional:  Positive for chills (last night 4-5 AM, resolved now).  Cardiovascular:  Positive for leg swelling.       (+) Hypertension  Gastrointestinal:  Positive for nausea (last night 4-5 AM, resolved now). Negative for vomiting.       (+) Flatulence/Eructation  Neurological:  Positive for dizziness (last night 4-5 AM, resolved now).  Otherwise mentioned in HPI.   Objective:  Vitals: BP 130/80 (BP Location: Left Arm, Patient Position: Standing)   Pulse 100   Temp 98.4 F (36.9 C)   Ht 5\' 3"  (1.6 m)   Wt 225 lb (102.1 kg)   SpO2 98%   BMI 39.86 kg/m   Physical Exam Vitals and nursing note reviewed.  Constitutional:      General: She is not in acute distress.    Appearance: Normal appearance. She is not toxic-appearing.  HENT:     Head: Normocephalic and atraumatic.     Left Ear: There is impacted cerumen.     Mouth/Throat:     Comments: Declines to open mouth Cardiovascular:     Rate and Rhythm: Normal rate  and regular rhythm. No extrasystoles are present.    Pulses: Normal pulses.     Heart sounds: Murmur (2 or 3) heard.     Systolic (ejection, left sternal border) murmur is present.     No friction rub. No gallop.  Pulmonary:     Effort: Pulmonary effort is normal. No respiratory distress.     Breath sounds: Normal breath sounds. No wheezing or rales.  Skin:    General: Skin is warm and dry.  Neurological:     Mental Status: She is alert and oriented to person, place, and time. Mental status is at baseline.  Psychiatric:        Mood and Affect: Mood normal.        Behavior: Behavior normal.         Thought Content: Thought content normal.        Judgment: Judgment normal.     Results:  Studies Obtained And Personally Reviewed By Me:  Echocardiogram 03/20/2019  IMPRESSION: Left ventricle cavity is normal in size. Mild concentric hypertrophy of  the left ventricle. Normal LV systolic function with EF 65-70%. Normal  global wall motion. Doppler evidence of grade I (impaired) diastolic  dysfunction, normal LAP.   Mild (Grade I) mitral regurgitation.  Mild tricuspid regurgitation. Estimated pulmonary artery systolic pressure  is 27 mmHg.   Mild pulmonic regurgitation.   Carotid Duplex (Bilateral) 03/20/2019 Doppler velocity suggests stenosis in the right internal carotid artery (16-49%).   Doppler velocity suggests stenosis in the right external carotid artery (>50%).  Doppler velocity suggests stenosis in the left internal carotid artery (50-69%).   Doppler velocity suggests stenosis in the left external carotid artery (>50%).  Antegrade right vertebral artery flow.   Antegrade left vertebral artery flow.   Lexiscan Tetrofosmin Stress Test 03/20/2019 Myocardial perfusion is normal.  Stress LV EF: 67%.   Labs:     Component Value Date/Time   NA 140 12/29/2022 1452   K 5.2 12/29/2022 1452   CL 104 12/29/2022 1452   CO2 25 12/29/2022 1452   GLUCOSE 94 12/29/2022 1452   BUN 25 12/29/2022 1452   CREATININE 1.35 (H) 12/29/2022 1452   CALCIUM 9.9 12/29/2022 1452   PROT 7.3 06/30/2022 1047   ALBUMIN 4.2 05/30/2016 0901   AST 14 06/30/2022 1047   ALT 5 (L) 06/30/2022 1047   ALKPHOS 50 05/30/2016 0901   BILITOT 0.4 06/30/2022 1047   GFRNONAA 46 (L) 10/01/2019 1139   GFRAA 54 (L) 10/01/2019 1139    Lab Results  Component Value Date   WBC 8.1 06/30/2022   HGB 12.8 06/30/2022   HCT 39.4 06/30/2022   MCV 91.2 06/30/2022   PLT 249 06/30/2022   Lab Results  Component Value Date   CHOL 301 (H) 06/30/2022   HDL 56 06/30/2022   LDLCALC 221 (H) 06/30/2022    TRIG 106 06/30/2022   CHOLHDL 5.4 (H) 06/30/2022   Lab Results  Component Value Date   HGBA1C 6.1 (H) 02/05/2019    Lab Results  Component Value Date   TSH 1.55 02/05/2019   Assessment & Plan:   Hypertension treated 10 mg Amlodipine daily, 20-25 mg Lisinopril-Hydrochlorothiazide, and  also takes 81 mg ASA daily. She endorses intermittent episodes of  elevated BP readings since increasing her Amlodipine in November 2024, however also mentions that she does occasionally miss taking meds. Discussed alternate blood pressure medications  or increase in current meds- pt declined and seemed unwilling to consider. Notably, she felt that most  of her sx were related to over consumption of cabbage earlier the night before. Offered to evaluate  cardiac murmur with 2D Echo, noted in the physical exam but pt declined. A few years ago was evaluated by Dr. Nadara Eaton. Had myocardial perfusion study 2021.Had carotid Doppler study.Echo showed normal LV function. Had grade I mitral regurgitation and mild tricuspid regurgitation. Mild pulmonic regurgitation.We agreed she could follow up with Dr. Tanya Nones, here PCP soon.   I,Emily Lagle,acting as a Neurosurgeon for Margaree Mackintosh, MD.,have documented all relevant documentation on the behalf of Margaree Mackintosh, MD,as directed by  Margaree Mackintosh, MD while in the presence of Margaree Mackintosh, MD.   I, Margaree Mackintosh, MD, have reviewed all documentation for this visit. The documentation on 03/22/23 for the exam, diagnosis, procedures, and orders are all accurate and complete.

## 2023-03-21 NOTE — Telephone Encounter (Signed)
Chief Complaint: dizziness Symptoms: HTN, nausea, chills Frequency: last night Pertinent Negatives: Patient denies chest pain, SOB, current dizziness Disposition: [] ED /[] Urgent Care (no appt availability in office) / [x] Appointment(In office/virtual)/ []  [facility]/ [] Home Care/ [] Refused Recommended Disposition /[] [facility]/ []  Follow-up with PCP Additional Notes: Patient c/o dizziness, nausea, chills last night. Reports had HTN noted on Sunday at church. Repeat BP this AM is below. Patient denies SOB, chest pain, current dizziness. Has hx of HTN and PCP has been adjusting medication regimen. Scheduled patient per protocol on 03/21/2023 at alternate [facility] PCP. Patient Caregiver verbalized understanding and to call back with worsening symptoms. Of note, son will take pt to acute visit.     Copied from CRM #[id number]. Topic: Clinical - Red Word Triage >> Mar 21, 2023  9:07 AM Elle L wrote: Red Word that prompted transfer to Nurse Triage: The patient states that her blood pressure was 202/75 on Sunday and then 198/78. She did not take her blood pressure last night but she had nausea, vertigo, chills, headache, and she almost called the paramedics. She is feeling better today but is still dizzy. Reason for Disposition  [1] NO dizziness now AND [2] one or more stroke risk factors (i.e., hypertension, diabetes, prior stroke/TIA/heart attack)  Answer Assessment - Initial Assessment Questions 1. BLOOD PRESSURE: "What is the blood pressure?" "Did you take at least two measurements 5 minutes apart?"     20 2/75 and 198/78 on Sunday Now 159/82 2. ONSET: "When did you take your blood pressure?"     Sunday at church  Issues with home BP machine reading "error" 3. HOW: "How did you take your blood pressure?" (e.g., automatic home BP monitor, visiting nurse)     Auto BP at church 4. HISTORY: "Do you have a history of high blood pressure?"     yes 5. MEDICINES: "Are you  taking any medicines for blood pressure?" "Have you missed any doses recently?"     Amlodipine - now taking whole pill Lisinopril ASA 6. OTHER SYMPTOMS: "Do you have any symptoms?" (e.g., blurred vision, chest pain, difficulty breathing, headache, weakness)     Vertigo Chills Nausea  Answer Assessment - Initial Assessment Questions 1. DESCRIPTION: "Describe your dizziness."     Not really 2. VERTIGO: "Do you feel like either you or the room is spinning or tilting?"      "I kept going to the bathroom, I was shaking so bad between chills and whole body trembling" 3. LIGHTHEADED: "Do you feel lightheaded?" (e.g., somewhat faint, woozy, weak upon standing)     no 4. SEVERITY: "How bad is it?"  "Can you walk?"   - MILD: Feels slightly dizzy and unsteady, but is walking normally.   - MODERATE: Feels unsteady when walking, but not falling; interferes with normal activities (e.g., school, work).   - SEVERE: Unable to walk without falling, or requires assistance to walk without falling.     Able to walk normally 5. ONSET:  "When did the dizziness begin?"     Last night around 0400 6. AGGRAVATING FACTORS: "Does anything make it worse?" (e.g., standing, change in head position)     Mylanta helped 7. CAUSE: "What do you think is causing the dizziness?"     I kept thinking "I was gonna vomit" And I've had this before and someone told me it was vertigo 8. RECURRENT SYMPTOM: "Have you had dizziness before?" If Yes, ask: "When was the last time?" "What happened that time?"  A long time ago 9. OTHER SYMPTOMS: "Do you have any other symptoms?" (e.g., headache, weakness, numbness, vomiting, earache)     Nausea chills H/A  Answer Assessment - Initial Assessment Questions 1. NAUSEA SEVERITY: "How bad is the nausea?" (e.g., mild, moderate, severe; dehydration, weight loss)   - MILD: loss of appetite without change in eating habits   - MODERATE: decreased oral intake without significant weight  loss, dehydration, or malnutrition   - SEVERE: inadequate caloric or fluid intake, significant weight loss, symptoms of dehydration     Mild, able to take some fluids 2. ONSET: "When did the nausea begin?"     Last night 3. VOMITING: "Any vomiting?" If Yes, ask: "How many times today?"     no 4. RECURRENT SYMPTOM: "Have you had nausea before?" If Yes, ask: "When was the last time?" "What happened that time?"     A long time ago 5. CAUSE: "What do you think is causing the nausea?"     I don't know  Protocols used: Blood Pressure - High-A-AH, Dizziness - Vertigo-A-AH, Nausea-A-AH

## 2023-03-22 ENCOUNTER — Encounter: Payer: Self-pay | Admitting: Internal Medicine

## 2023-03-22 NOTE — Patient Instructions (Addendum)
Discussed treatment options for elevated BP and suggested further evaluation/ follow up with dr. Nadara Eaton who has seen her previously a few years ago. She declines any med change. Agrees to follow up with PCP, Dr. Tanya Nones and to monitor BP at home. Contact PCP if symptoms worsen.

## 2023-04-04 DIAGNOSIS — R002 Palpitations: Secondary | ICD-10-CM | POA: Diagnosis not present

## 2023-04-21 ENCOUNTER — Other Ambulatory Visit: Payer: Self-pay

## 2023-04-21 DIAGNOSIS — I10 Essential (primary) hypertension: Secondary | ICD-10-CM

## 2023-04-21 DIAGNOSIS — R002 Palpitations: Secondary | ICD-10-CM

## 2023-04-21 DIAGNOSIS — I340 Nonrheumatic mitral (valve) insufficiency: Secondary | ICD-10-CM

## 2023-04-21 DIAGNOSIS — R42 Dizziness and giddiness: Secondary | ICD-10-CM

## 2023-04-21 MED ORDER — METOPROLOL SUCCINATE ER 25 MG PO TB24: 25.0000 mg | ORAL_TABLET | Freq: Every day | ORAL | 3 refills | Status: DC

## 2023-09-04 ENCOUNTER — Other Ambulatory Visit: Payer: Self-pay

## 2023-09-04 ENCOUNTER — Ambulatory Visit: Admitting: Family Medicine

## 2023-09-04 ENCOUNTER — Encounter: Payer: Self-pay | Admitting: Family Medicine

## 2023-09-04 DIAGNOSIS — I10 Essential (primary) hypertension: Secondary | ICD-10-CM | POA: Diagnosis not present

## 2023-09-04 DIAGNOSIS — R42 Dizziness and giddiness: Secondary | ICD-10-CM

## 2023-09-04 DIAGNOSIS — I340 Nonrheumatic mitral (valve) insufficiency: Secondary | ICD-10-CM

## 2023-09-04 DIAGNOSIS — R002 Palpitations: Secondary | ICD-10-CM

## 2023-09-04 MED ORDER — AMLODIPINE BESYLATE 10 MG PO TABS: 10.0000 mg | ORAL_TABLET | Freq: Every day | ORAL | 3 refills | Status: DC

## 2023-09-04 MED ORDER — LISINOPRIL-HYDROCHLOROTHIAZIDE 20-25 MG PO TABS: 1.0000 | ORAL_TABLET | Freq: Every day | ORAL | 3 refills | Status: DC

## 2023-09-04 MED ORDER — METOPROLOL SUCCINATE ER 25 MG PO TB24: 25.0000 mg | ORAL_TABLET | Freq: Every day | ORAL | 3 refills | Status: DC

## 2023-09-04 NOTE — Progress Notes (Signed)
 Subjective:    Patient ID: Bonnie Bennett, female    DOB: 06-10-1931, 89 y.o.   MRN: [id number]  Hypertension  I saw patient for palpitations in January.  14-day monitor revealed episodes of SVT.  Suggested starting metoprolol . (SVT Nonsustained 15 episodes; fastest 194 bpm for 9 beats; longest for 12.8 secs at 124 bpm) Patient is currently taking lisinopril  hydrochlorothiazide .  She is not taking metoprolol .  She is not taking amlodipine .  She has been out of those medications for more than 2 weeks.  She states that she never started the metoprolol .  Her blood pressure at home has been extremely variable but is consistently greater than 140 systolic.  She recently had a blood pressure of 200 and was having a headache associated with it. Past Medical History:  Diagnosis Date   HLD (hyperlipidemia)    Hypertension    Current Outpatient Medications on File Prior to Visit  Medication Sig Dispense Refill   amLODipine  (NORVASC ) 10 MG tablet Take 1 tablet (10 mg total) by mouth daily. 90 tablet 3   aspirin EC 81 MG tablet Take 81 mg by mouth daily. Reported on 06/12/2015     lisinopril -hydrochlorothiazide  (ZESTORETIC ) 20-25 MG tablet Take 1 tablet by mouth daily. 90 tablet 3   metoprolol  succinate (TOPROL -XL) 25 MG 24 hr tablet Take 1 tablet (25 mg total) by mouth daily. 90 tablet 3   Multiple Vitamin (MULTIVITAMIN) tablet Take 1 tablet by mouth daily.     psyllium (METAMUCIL) 58.6 % packet Take 1 packet by mouth daily.     No current facility-administered medications on file prior to visit.   No Known Allergies Social History   Socioeconomic History   Marital status: Widowed    Spouse name: Not on file   Number of children: 7   Years of education: Not on file   Highest education level: Not on file  Occupational History   Not on file  Tobacco Use   Smoking status: Former    Current packs/day: 0.00    Average packs/day: 0.3 packs/day for 45.0 years (11.3 ttl pk-yrs)    Types:  Cigarettes    Start date: 93    Quit date: 55    Years since quitting: 35.5   Smokeless tobacco: Never  Vaping Use   Vaping status: Never Used  Substance and Sexual Activity   Alcohol use: Yes    Comment: occasional   Drug use: No   Sexual activity: Not on file  Other Topics Concern   Not on file  Social History Narrative   6 children living. 1 son died in 2019/09/12.   Social Drivers of Health   Financial Resource Strain: Medium Risk (03/01/2023)   Overall Financial Resource Strain (CARDIA)    Difficulty of Paying Living Expenses: Somewhat hard  Food Insecurity: No Food Insecurity (03/01/2023)   Hunger Vital Sign    Worried About Running Out of Food in the Last Year: Never true    Ran Out of Food in the Last Year: Never true  Transportation Needs: No Transportation Needs (03/01/2023)   PRAPARE - Administrator, Civil Service (Medical): No    Lack of Transportation (Non-Medical): No  Physical Activity: Insufficiently Active (03/01/2023)   Exercise Vital Sign    Days of Exercise per Week: 3 days    Minutes of Exercise per Session: 20 min  Stress: No Stress Concern Present (03/01/2023)   [employer] of Occupational Health - Occupational Stress Questionnaire  Feeling of Stress : Not at all  Social Connections: Moderately Integrated (03/01/2023)   Social Connection and Isolation Panel    Frequency of Communication with Friends and Family: Three times a week    Frequency of Social Gatherings with Friends and Family: Three times a week    Attends Religious Services: 1 to 4 times per year    Active Member of Clubs or Organizations: Yes    Attends Banker Meetings: More than 4 times per year    Marital Status: Widowed  Intimate Partner Violence: Not At Risk (03/01/2023)   Humiliation, Afraid, Rape, and Kick questionnaire    Fear of Current or Ex-Partner: No    Emotionally Abused: No    Physically Abused: No    Sexually Abused: No      Review of  Systems     Objective:   Physical Exam Vitals reviewed.  Constitutional:      General: She is not in acute distress.    Appearance: Normal appearance. She is obese. She is not ill-appearing, toxic-appearing or diaphoretic.  HENT:     Head: Normocephalic and atraumatic.     Right Ear: Tympanic membrane and ear canal normal.     Left Ear: Tympanic membrane and ear canal normal.     Nose: No congestion or rhinorrhea.     Mouth/Throat:     Mouth: Mucous membranes are moist.     Pharynx: Oropharynx is clear. No oropharyngeal exudate or posterior oropharyngeal erythema.  Eyes:     Extraocular Movements: Extraocular movements intact.     Conjunctiva/sclera: Conjunctivae normal.     Pupils: Pupils are equal, round, and reactive to light.  Neck:     Vascular: No carotid bruit.  Cardiovascular:     Rate and Rhythm: Normal rate and regular rhythm.     Heart sounds: Normal heart sounds. No murmur heard. Pulmonary:     Effort: Pulmonary effort is normal. No respiratory distress.     Breath sounds: Normal breath sounds. No stridor. No wheezing, rhonchi or rales.  Chest:     Chest wall: No tenderness.  Abdominal:     General: Abdomen is flat. Bowel sounds are normal. There is no distension.     Palpations: Abdomen is soft.     Tenderness: There is no abdominal tenderness. There is no guarding or rebound.  Musculoskeletal:     Cervical back: No tenderness.     Right knee: No swelling, effusion, erythema or ecchymosis. Normal range of motion. No tenderness.     Left knee: No swelling, effusion, erythema or ecchymosis. Normal range of motion. No tenderness.     Right lower leg: No edema.     Left lower leg: No edema.  Lymphadenopathy:     Cervical: No cervical adenopathy.  Skin:    General: Skin is warm.     Coloration: Skin is not jaundiced.     Findings: No bruising, erythema, lesion or rash.  Neurological:     General: No focal deficit present.     Mental Status: She is alert and  oriented to person, place, and time. Mental status is at baseline.     Cranial Nerves: No cranial nerve deficit.     Motor: No weakness.     Coordination: Coordination normal.     Gait: Gait normal.  Psychiatric:        Mood and Affect: Mood normal.        Behavior: Behavior normal.  Thought Content: Thought content normal.        Judgment: Judgment normal.          Assessment & Plan:  Benign essential HTN - Plan: amLODipine  (NORVASC ) 10 MG tablet, CBC with Differential/Platelet, Comprehensive metabolic panel with GFR  Dizziness - Plan: metoprolol  succinate (TOPROL -XL) 25 MG 24 hr tablet  Mitral valve insufficiency, unspecified etiology - Plan: metoprolol  succinate (TOPROL -XL) 25 MG 24 hr tablet  Palpitations - Plan: metoprolol  succinate (TOPROL -XL) 25 MG 24 hr tablet  Primary hypertension - Plan: metoprolol  succinate (TOPROL -XL) 25 MG 24 hr tablet I emphasized how dangerous her blood pressure is and then we need to reduce her blood pressure.  It is elevated today but she is only on lisinopril  hydrochlorothiazide .  Start Toprol -XL 25 mg daily along with amlodipine  10 mg daily in addition to the lisinopril  and recheck blood pressure in 2 weeks.  I was very clear with the patient that her goal blood pressure is less than 140/90.  She is to recheck with me in 2 weeks it and we can ensure the blood pressure is better.

## 2023-09-05 ENCOUNTER — Ambulatory Visit: Payer: Self-pay | Admitting: Family Medicine

## 2023-09-05 LAB — COMPREHENSIVE METABOLIC PANEL WITH GFR
AG Ratio: 1.3 (calc) (ref 1.0–2.5)
ALT: 6 U/L (ref 6–29)
AST: 16 U/L (ref 10–35)
Albumin: 4.3 g/dL (ref 3.6–5.1)
Alkaline phosphatase (APISO): 55 U/L (ref 37–153)
BUN/Creatinine Ratio: 15 (calc) (ref 6–22)
BUN: 21 mg/dL (ref 7–25)
CO2: 28 mmol/L (ref 20–32)
Calcium: 9.7 mg/dL (ref 8.6–10.4)
Chloride: 106 mmol/L (ref 98–110)
Creat: 1.38 mg/dL — ABNORMAL HIGH (ref 0.60–0.95)
Globulin: 3.2 g/dL (ref 1.9–3.7)
Glucose, Bld: 97 mg/dL (ref 65–99)
Potassium: 4.9 mmol/L (ref 3.5–5.3)
Sodium: 143 mmol/L (ref 135–146)
Total Bilirubin: 0.4 mg/dL (ref 0.2–1.2)
Total Protein: 7.5 g/dL (ref 6.1–8.1)
eGFR: 36 mL/min/1.73m2 — ABNORMAL LOW (ref >=60)

## 2023-09-05 LAB — CBC WITH DIFFERENTIAL/PLATELET
Absolute Lymphocytes: 2723 {cells}/uL (ref 850–3900)
Absolute Monocytes: 616 {cells}/uL (ref 200–950)
Basophils Absolute: 37 {cells}/uL (ref 0–200)
Basophils Relative: 0.4 %
Eosinophils Absolute: 92 {cells}/uL (ref 15–500)
Eosinophils Relative: 1 %
HCT: 38.8 % (ref 35.0–45.0)
Hemoglobin: 12.3 g/dL (ref 11.7–15.5)
MCH: 29.7 pg (ref 27.0–33.0)
MCHC: 31.7 g/dL — ABNORMAL LOW (ref 32.0–36.0)
MCV: 93.7 fL (ref 80.0–100.0)
MPV: 10.8 fL (ref 7.5–12.5)
Monocytes Relative: 6.7 %
Neutro Abs: 5732 {cells}/uL (ref 1500–7800)
Neutrophils Relative %: 62.3 %
Platelets: 260 Thousand/uL (ref 140–400)
RBC: 4.14 Million/uL (ref 3.80–5.10)
RDW: 13.6 % (ref 11.0–15.0)
Total Lymphocyte: 29.6 %
WBC: 9.2 Thousand/uL (ref 3.8–10.8)

## 2023-09-19 ENCOUNTER — Ambulatory Visit: Admitting: Family Medicine

## 2023-09-19 ENCOUNTER — Encounter: Payer: Self-pay | Admitting: Family Medicine

## 2023-09-19 VITALS — BP 138/52 | HR 65 | Temp 97.6°F | Ht 63.0 in | Wt 217.2 lb

## 2023-09-19 DIAGNOSIS — I10 Essential (primary) hypertension: Secondary | ICD-10-CM

## 2023-09-19 NOTE — Progress Notes (Signed)
 Subjective:    Patient ID: Bonnie Bennett, female    DOB: May 26, 1931, 89 y.o.   MRN: [id number]  Hypertension  I saw patient for palpitations in January.  14-day monitor revealed episodes of SVT.  Suggested starting metoprolol . (SVT Nonsustained 15 episodes; fastest 194 bpm for 9 beats; longest for 12.8 secs at 124 bpm) Patient is currently taking lisinopril  hydrochlorothiazide .  She is not taking metoprolol .  She is not taking amlodipine .  She has been out of those medications for more than 2 weeks.  She states that she never started the metoprolol .  Her blood pressure at home has been extremely variable but is consistently greater than 140 systolic.  She recently had a blood pressure of 200 and was having a headache associated with it.  At that time, my plan was: I emphasized how dangerous her blood pressure is and then we need to reduce her blood pressure.  It is elevated today but she is only on lisinopril  hydrochlorothiazide .  Start Toprol -XL 25 mg daily along with amlodipine  10 mg daily in addition to the lisinopril  and recheck blood pressure in 2 weeks.  I was very clear with the patient that her goal blood pressure is less than 140/90.  She is to recheck with me in 2 weeks it and we can ensure the blood pressure is better.  09/19/23 Please see the office visit from 2 weeks ago above.  Patient is here today for rectal pressure.  I personally checked her blood pressure and found it to be 138/52.  Patient denies any chest pain or shortness of breath or dyspnea on exertion.  She denies any dizziness on the blood pressure medicine.  She denies any fatigue. Past Medical History:  Diagnosis Date   HLD (hyperlipidemia)    Hypertension    Current Outpatient Medications on File Prior to Visit  Medication Sig Dispense Refill   amLODipine  (NORVASC ) 10 MG tablet Take 1 tablet (10 mg total) by mouth daily. 90 tablet 3   aspirin EC 81 MG tablet Take 81 mg by mouth daily. Reported on 06/12/2015      lisinopril -hydrochlorothiazide  (ZESTORETIC ) 20-25 MG tablet Take 1 tablet by mouth daily. 90 tablet 3   metoprolol  succinate (TOPROL -XL) 25 MG 24 hr tablet Take 1 tablet (25 mg total) by mouth daily. 90 tablet 3   Multiple Vitamin (MULTIVITAMIN) tablet Take 1 tablet by mouth daily.     psyllium (METAMUCIL) 58.6 % packet Take 1 packet by mouth daily.     No current facility-administered medications on file prior to visit.   No Known Allergies Social History   Socioeconomic History   Marital status: Widowed    Spouse name: Not on file   Number of children: 7   Years of education: Not on file   Highest education level: Not on file  Occupational History   Not on file  Tobacco Use   Smoking status: Former    Current packs/day: 0.00    Average packs/day: 0.3 packs/day for 45.0 years (11.3 ttl pk-yrs)    Types: Cigarettes    Start date: 55    Quit date: 87    Years since quitting: 35.6   Smokeless tobacco: Never  Vaping Use   Vaping status: Never Used  Substance and Sexual Activity   Alcohol use: Yes    Comment: occasional   Drug use: No   Sexual activity: Not on file  Other Topics Concern   Not on file  Social History Narrative  6 children living. 1 son died in October 15, 2019.   Social Drivers of Health   Financial Resource Strain: Medium Risk (03/01/2023)   Overall Financial Resource Strain (CARDIA)    Difficulty of Paying Living Expenses: Somewhat hard  Food Insecurity: No Food Insecurity (03/01/2023)   Hunger Vital Sign    Worried About Running Out of Food in the Last Year: Never true    Ran Out of Food in the Last Year: Never true  Transportation Needs: No Transportation Needs (03/01/2023)   PRAPARE - Administrator, Civil Service (Medical): No    Lack of Transportation (Non-Medical): No  Physical Activity: Insufficiently Active (03/01/2023)   Exercise Vital Sign    Days of Exercise per Week: 3 days    Minutes of Exercise per Session: 20 min  Stress: No Stress  Concern Present (03/01/2023)   [employer] of Occupational Health - Occupational Stress Questionnaire    Feeling of Stress : Not at all  Social Connections: Moderately Integrated (03/01/2023)   Social Connection and Isolation Panel    Frequency of Communication with Friends and Family: Three times a week    Frequency of Social Gatherings with Friends and Family: Three times a week    Attends Religious Services: 1 to 4 times per year    Active Member of Clubs or Organizations: Yes    Attends Banker Meetings: More than 4 times per year    Marital Status: Widowed  Intimate Partner Violence: Not At Risk (03/01/2023)   Humiliation, Afraid, Rape, and Kick questionnaire    Fear of Current or Ex-Partner: No    Emotionally Abused: No    Physically Abused: No    Sexually Abused: No      Review of Systems     Objective:   Physical Exam Vitals reviewed.  Constitutional:      General: She is not in acute distress.    Appearance: Normal appearance. She is obese. She is not ill-appearing, toxic-appearing or diaphoretic.  HENT:     Head: Normocephalic and atraumatic.     Right Ear: Tympanic membrane and ear canal normal.     Left Ear: Tympanic membrane and ear canal normal.     Nose: No congestion or rhinorrhea.     Mouth/Throat:     Mouth: Mucous membranes are moist.     Pharynx: Oropharynx is clear. No oropharyngeal exudate or posterior oropharyngeal erythema.  Eyes:     Extraocular Movements: Extraocular movements intact.     Conjunctiva/sclera: Conjunctivae normal.     Pupils: Pupils are equal, round, and reactive to light.  Neck:     Vascular: No carotid bruit.  Cardiovascular:     Rate and Rhythm: Normal rate and regular rhythm.     Heart sounds: Normal heart sounds. No murmur heard. Pulmonary:     Effort: Pulmonary effort is normal. No respiratory distress.     Breath sounds: Normal breath sounds. No stridor. No wheezing, rhonchi or rales.  Chest:     Chest  wall: No tenderness.  Abdominal:     General: Abdomen is flat. Bowel sounds are normal. There is no distension.     Palpations: Abdomen is soft.     Tenderness: There is no abdominal tenderness. There is no guarding or rebound.  Musculoskeletal:     Cervical back: No tenderness.     Right knee: No swelling, effusion, erythema or ecchymosis. Normal range of motion. No tenderness.     Left knee: No swelling,  effusion, erythema or ecchymosis. Normal range of motion. No tenderness.     Right lower leg: No edema.     Left lower leg: No edema.  Lymphadenopathy:     Cervical: No cervical adenopathy.  Skin:    General: Skin is warm.     Coloration: Skin is not jaundiced.     Findings: No bruising, erythema, lesion or rash.  Neurological:     General: No focal deficit present.     Mental Status: She is alert and oriented to person, place, and time. Mental status is at baseline.     Cranial Nerves: No cranial nerve deficit.     Motor: No weakness.     Coordination: Coordination normal.     Gait: Gait normal.  Psychiatric:        Mood and Affect: Mood normal.        Behavior: Behavior normal.        Thought Content: Thought content normal.        Judgment: Judgment normal.          Assessment & Plan:  Benign essential HTN Patient's dizziness and blood pressure are much better.  Continue lisinopril  hydrochlorothiazide  along with metoprolol  in the morning.  Take amlodipine  in the afternoon to try to spread the medication out.  Recheck lab work in 6 months or sooner if problems arise

## 2023-12-05 NOTE — Progress Notes (Signed)
   12/05/2023  Patient ID: Albino CHRISTELLA Bunker, female   DOB: 05/27/1931, 89 y.o.   MRN: [id number]  Pharmacy Quality Measure Review  This patient is appearing on a report for being at risk of failing the adherence measure for hypertension (ACEi/ARB) medications this calendar year.   Medication: lisinopril benson Last fill date: 09/04/23 for 90 day supply  Insurance report was not up to date. No action needed at this time.   Lang Sieve, PharmD, BCGP Clinical Pharmacist  [phone]

## 2024-02-22 ENCOUNTER — Ambulatory Visit

## 2024-02-22 VITALS — Ht 63.0 in | Wt 217.0 lb

## 2024-02-22 DIAGNOSIS — R42 Dizziness and giddiness: Secondary | ICD-10-CM

## 2024-02-22 DIAGNOSIS — Z0000 Encounter for general adult medical examination without abnormal findings: Secondary | ICD-10-CM

## 2024-02-22 DIAGNOSIS — I340 Nonrheumatic mitral (valve) insufficiency: Secondary | ICD-10-CM

## 2024-02-22 DIAGNOSIS — I10 Essential (primary) hypertension: Secondary | ICD-10-CM

## 2024-02-22 DIAGNOSIS — R002 Palpitations: Secondary | ICD-10-CM

## 2024-02-22 MED ORDER — LISINOPRIL-HYDROCHLOROTHIAZIDE 20-25 MG PO TABS: 1.0000 | ORAL_TABLET | Freq: Every day | ORAL | 3 refills | Status: AC

## 2024-02-22 MED ORDER — AMLODIPINE BESYLATE 10 MG PO TABS: 10.0000 mg | ORAL_TABLET | Freq: Every day | ORAL | 3 refills | Status: AC

## 2024-02-22 MED ORDER — METOPROLOL SUCCINATE ER 25 MG PO TB24: 25.0000 mg | ORAL_TABLET | Freq: Every day | ORAL | 3 refills | Status: AC

## 2024-02-22 NOTE — Progress Notes (Signed)
 "  Chief Complaint  Patient presents with   Medicare Wellness     Subjective:   Bonnie Bennett is a 89 y.o. female who presents for a Medicare Annual Wellness Visit.  Visit info / Clinical Intake: Medicare Wellness Visit Type:: Subsequent Annual Wellness Visit Persons participating in visit and providing information:: patient Medicare Wellness Visit Mode:: Telephone If telephone:: video declined Since this visit was completed virtually, some vitals may be partially provided or unavailable. Missing vitals are due to the limitations of the virtual format.: Documented vitals are patient reported If Telephone or Video please confirm:: I connected with patient using audio/video enable telemedicine. I verified patient identity with two identifiers, discussed telehealth limitations, and patient agreed to proceed. Patient Location:: home Provider Location:: office Interpreter Needed?: No Pre-visit prep was completed: yes AWV questionnaire completed by patient prior to visit?: no Living arrangements:: (!) lives alone Patient's Overall Health Status Rating: good Typical amount of pain: some Does pain affect daily life?: no Are you currently prescribed opioids?: no  Dietary Habits and Nutritional Risks How many meals a day?: 3 Eats fruit and vegetables daily?: yes Most meals are obtained by: preparing own meals In the last 2 weeks, have you had any of the following?: none Diabetic:: no  Functional Status Activities of Daily Living (to include ambulation/medication): Independent Ambulation: Independent with device- listed below Home Assistive Devices/Equipment: Cane Medication Administration: Independent Home Management (perform basic housework or laundry): Independent Manage your own finances?: yes Primary transportation is: driving Concerns about vision?: no *vision screening is required for WTM* Concerns about hearing?: no  Fall Screening Falls in the past year?: 0 Number  of falls in past year: 0 Was there an injury with Fall?: 0 Fall Risk Category Calculator: 0 Patient Fall Risk Level: Low Fall Risk  Fall Risk Patient at Risk for Falls Due to: Impaired mobility Fall risk Follow up: Falls evaluation completed; Education provided; Falls prevention discussed  Home and Transportation Safety: All rugs have non-skid backing?: yes All stairs or steps have railings?: yes Grab bars in the bathtub or shower?: yes Have non-skid surface in bathtub or shower?: yes Good home lighting?: yes Regular seat belt use?: yes Hospital stays in the last year:: no  Cognitive Assessment Difficulty concentrating, remembering, or making decisions? : no Will 6CIT or Mini Cog be Completed: yes What year is it?: 0 points What month is it?: 0 points Give patient an address phrase to remember (5 components): [id number] [place] About what time is it?: 0 points Count backwards from 20 to 1: 0 points Say the months of the year in reverse: 0 points Repeat the address phrase from earlier: 2 points 6 CIT Score: 2 points  Advance Directives (For Healthcare) Does Patient Have a Medical Advance Directive?: No Would patient like information on creating a medical advance directive?: Yes (MAU/Ambulatory/Procedural Areas - Information given)  Reviewed/Updated  Reviewed/Updated: Reviewed All (Medical, Surgical, Family, Medications, Allergies, Care Teams, Patient Goals)    Allergies (verified) Patient has no known allergies.   Current Medications (verified) Outpatient Encounter Medications as of 02/22/2024  Medication Sig   amLODipine  (NORVASC ) 10 MG tablet Take 1 tablet (10 mg total) by mouth daily.   aspirin EC 81 MG tablet Take 81 mg by mouth daily. Reported on 06/12/2015   lisinopril -hydrochlorothiazide  (ZESTORETIC ) 20-25 MG tablet Take 1 tablet by mouth daily.   metoprolol  succinate (TOPROL -XL) 25 MG 24 hr tablet Take 1 tablet (25 mg total) by mouth daily.   Multiple  Vitamin (MULTIVITAMIN) tablet Take 1 tablet by mouth daily.   psyllium (METAMUCIL) 58.6 % packet Take 1 packet by mouth daily.   [DISCONTINUED] amLODipine  (NORVASC ) 10 MG tablet Take 1 tablet (10 mg total) by mouth daily.   [DISCONTINUED] lisinopril -hydrochlorothiazide  (ZESTORETIC ) 20-25 MG tablet Take 1 tablet by mouth daily.   [DISCONTINUED] metoprolol  succinate (TOPROL -XL) 25 MG 24 hr tablet Take 1 tablet (25 mg total) by mouth daily.   No facility-administered encounter medications on file as of 02/22/2024.    History: Past Medical History:  Diagnosis Date   HLD (hyperlipidemia)    Hypertension    Past Surgical History:  Procedure Laterality Date   ABDOMINAL HYSTERECTOMY     KNEE SURGERY Bilateral    Family History  Problem Relation Age of Onset   Kidney failure Mother    Stroke Father    Heart disease Sister    Heart disease Brother    Kidney disease Sister    Social History   Occupational History   Not on file  Tobacco Use   Smoking status: Former    Current packs/day: 0.00    Average packs/day: 0.3 packs/day for 45.0 years (11.3 ttl pk-yrs)    Types: Cigarettes    Start date: 69    Quit date: 1990    Years since quitting: 36.0   Smokeless tobacco: Never  Vaping Use   Vaping status: Never Used  Substance and Sexual Activity   Alcohol use: Yes    Comment: occasional   Drug use: No   Sexual activity: Not on file   Tobacco Counseling Counseling given: Not Answered  SDOH Screenings   Food Insecurity: No Food Insecurity (02/22/2024)  Housing: Low Risk (02/22/2024)  Transportation Needs: No Transportation Needs (02/22/2024)  Utilities: Not At Risk (02/22/2024)  Alcohol Screen: Low Risk (03/01/2023)  Depression (PHQ2-9): Low Risk (02/22/2024)  Financial Resource Strain: Medium Risk (03/01/2023)  Physical Activity: Insufficiently Active (02/22/2024)  Social Connections: Moderately Integrated (02/22/2024)  Stress: No Stress Concern Present (02/22/2024)  Tobacco  Use: Medium Risk (02/22/2024)  Health Literacy: Adequate Health Literacy (02/22/2024)   See flowsheets for full screening details  Depression Screen PHQ 2 & 9 Depression Scale- Over the past 2 weeks, how often have you been bothered by any of the following problems? Little interest or pleasure in doing things: 0 Feeling down, depressed, or hopeless (PHQ Adolescent also includes...irritable): 0 PHQ-2 Total Score: 0     Goals Addressed             This Visit's Progress    Maintain health and independence   On track            Objective:    Today's Vitals   02/22/24 1005  Weight: 217 lb (98.4 kg)  Height: 5' 3 (1.6 m)   Body mass index is 38.44 kg/m.  Hearing/Vision screen No results found. Immunizations and Health Maintenance Health Maintenance  Topic Date Due   Zoster Vaccines- Shingrix (1 of 2) Never done   COVID-19 Vaccine (2 - 2025-26 season) 10/09/2023   Influenza Vaccine  05/07/2024 (Originally 09/08/2023)   DTaP/Tdap/Td (1 - Tdap) 09/18/2024 (Originally 08/01/1950)   Bone Density Scan  09/18/2024 (Originally 08/02/1998)   Medicare Annual Wellness (AWV)  02/21/2025   Pneumococcal Vaccine: 50+ Years  Completed   Meningococcal B Vaccine  Aged Out        Assessment/Plan:  This is a routine wellness examination for [place].  Patient Care Team: Duanne Butler DASEN, MD as PCP - General (  Family Medicine)  I have personally reviewed and noted the following in the patients chart:   Medical and social history Use of alcohol, tobacco or illicit drugs  Current medications and supplements including opioid prescriptions. Functional ability and status Nutritional status Physical activity Advanced directives List of other physicians Hospitalizations, surgeries, and ER visits in previous 12 months Vitals Screenings to include cognitive, depression, and falls Referrals and appointments  No orders of the defined types were placed in this encounter.  In addition, I  have reviewed and discussed with patient certain preventive protocols, quality metrics, and best practice recommendations. A written personalized care plan for preventive services as well as general preventive health recommendations were provided to patient.   Lavelle Charmaine Browner, LPN   8/84/7973   Return in 1 year (on 02/21/2025).  After Visit Summary: (Mail) Due to this being a telephonic visit, the after visit summary with patients personalized plan was offered to patient via mail   Nurse Notes: Patient advised to keep follow-up appointment with PCP (03/21/21 @ 3:45)  "

## 2024-02-22 NOTE — Patient Instructions (Signed)
 Ms. Crossno,  Thank you for taking the time for your Medicare Wellness Visit. I appreciate your continued commitment to your health goals. Please review the care plan we discussed, and feel free to reach out if I can assist you further.  Please note that Annual Wellness Visits do not include a physical exam. Some assessments may be limited, especially if the visit was conducted virtually. If needed, we may recommend an in-person follow-up with your provider.  Ongoing Care Seeing your primary care provider every 3 to 6 months helps us  monitor your health and provide consistent, personalized care.   Referrals If a referral was made during today's visit and you haven't received any updates within two weeks, please contact the referred provider directly to check on the status.  Recommended Screenings:  Health Maintenance  Topic Date Due   Zoster (Shingles) Vaccine (1 of 2) Never done   COVID-19 Vaccine (2 - 2025-26 season) 10/09/2023   Flu Shot  05/07/2024*   DTaP/Tdap/Td vaccine (1 - Tdap) 09/18/2024*   Osteoporosis screening with Bone Density Scan  09/18/2024*   Medicare Annual Wellness Visit  02/21/2025   Pneumococcal Vaccine for age over 52  Completed   Meningitis B Vaccine  Aged Out  *Topic was postponed. The date shown is not the original due date.       02/22/2024   10:06 AM  Advanced Directives  Does Patient Have a Medical Advance Directive? No  Would patient like information on creating a medical advance directive? Yes (MAU/Ambulatory/Procedural Areas - Information given)   Information on Advanced Care Planning can be found at [place]  Secretary of [facility] Advance Health Care Directives Advance Health Care Directives ([email or web address]   Vision: Annual vision screenings are recommended for early detection of glaucoma, cataracts, and diabetic retinopathy. These exams can also reveal signs of chronic conditions such as diabetes and high blood pressure.  Dental: Annual dental  screenings help detect early signs of oral cancer, gum disease, and other conditions linked to overall health, including heart disease and diabetes.  Please see the attached documents for additional preventive care recommendations.

## 2024-03-21 ENCOUNTER — Ambulatory Visit: Admitting: Family Medicine
